# Patient Record
Sex: Male | Born: 1970 | Race: White | Hispanic: No | Marital: Married | Smoking: Former smoker
Health system: Southern US, Community
[De-identification: ages and names within clinical notes are randomized; demographics above are authoritative.]

## PROBLEM LIST (undated history)

## (undated) DIAGNOSIS — I10 Essential (primary) hypertension: Secondary | ICD-10-CM

## (undated) DIAGNOSIS — H9319 Tinnitus, unspecified ear: Secondary | ICD-10-CM

## (undated) DIAGNOSIS — B029 Zoster without complications: Secondary | ICD-10-CM

## (undated) DIAGNOSIS — R42 Dizziness and giddiness: Secondary | ICD-10-CM

## (undated) HISTORY — DX: Dizziness and giddiness: R42

## (undated) HISTORY — DX: Essential (primary) hypertension: I10

## (undated) HISTORY — DX: Tinnitus, unspecified ear: H93.19

## (undated) HISTORY — PX: APPENDECTOMY: SHX54

## (undated) HISTORY — DX: Zoster without complications: B02.9

---

## 2008-06-13 ENCOUNTER — Ambulatory Visit: Payer: Self-pay | Admitting: Occupational Medicine

## 2008-12-02 ENCOUNTER — Ambulatory Visit: Payer: Self-pay | Admitting: Family Medicine

## 2008-12-02 DIAGNOSIS — B029 Zoster without complications: Secondary | ICD-10-CM

## 2008-12-02 HISTORY — DX: Zoster without complications: B02.9

## 2008-12-10 ENCOUNTER — Ambulatory Visit: Payer: Self-pay | Admitting: Occupational Medicine

## 2009-08-07 ENCOUNTER — Ambulatory Visit: Payer: Self-pay | Admitting: Occupational Medicine

## 2009-11-15 ENCOUNTER — Ambulatory Visit: Payer: Self-pay | Admitting: Family Medicine

## 2009-11-15 DIAGNOSIS — R42 Dizziness and giddiness: Secondary | ICD-10-CM

## 2009-11-15 HISTORY — DX: Dizziness and giddiness: R42

## 2009-12-22 ENCOUNTER — Ambulatory Visit: Payer: Self-pay | Admitting: Family Medicine

## 2009-12-22 DIAGNOSIS — J029 Acute pharyngitis, unspecified: Secondary | ICD-10-CM | POA: Insufficient documentation

## 2009-12-23 ENCOUNTER — Encounter: Payer: Self-pay | Admitting: Family Medicine

## 2010-04-02 ENCOUNTER — Ambulatory Visit: Payer: Self-pay | Admitting: Emergency Medicine

## 2010-04-02 DIAGNOSIS — H9319 Tinnitus, unspecified ear: Secondary | ICD-10-CM | POA: Insufficient documentation

## 2010-04-02 HISTORY — DX: Tinnitus, unspecified ear: H93.19

## 2010-09-18 NOTE — Assessment & Plan Note (Signed)
Summary: Sorethroat x 7 dys rm 3   Vital Signs:  Patient Profile:   40 Years Old Male CC:      Cold & URI symptoms Height:     68 inches Weight:      184 pounds O2 Sat:      100 % O2 treatment:    Room Air Temp:     98.0 degrees F oral Pulse rate:   81 / minute Pulse rhythm:   regular Resp:     16 per minute BP sitting:   130 / 91  (right arm) Cuff size:   regular  Vitals Entered By: Areta Haber CMA (Dec 22, 2009 6:55 PM)                  Current Allergies: No known allergies History of Present Illness Chief Complaint: Cold & URI symptoms History of Present Illness: Subjective: Patient complains of persistent sore throat for one week. 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  Eyes       Denies change in vision, eye pain, eye discharge, glasses, contact lenses, and eye surgery. Ear/Nose/Throat/Mouth       Complains of sore throat.      Denies hearing loss/aids, change in hearing, ear pain, ear discharge, dizziness, frequent runny nose, frequent nose bleeds, sinus problems, hoarseness, and tooth pain or bleeding.      Comments: x 7 dys  Respiratory       Denies dry cough, productive cough, wheezing, shortness of breath, asthma, bronchitis, and  emphysema/COPD.  Cardiovascular       Denies murmurs, chest pain, and tires easily with exhertion.    Gastrointestinal       Denies stomach pain, nausea/vomiting, diarrhea, constipation, blood in bowel movements, and indigestion. Genitourniary       Denies painful urination, kidney stones, and loss of urinary control. Neurological       Denies paralysis, seizures, and fainting/blackouts. Musculoskeletal       Denies muscle pain, joint pain, joint stiffness, decreased range of motion, redness, swelling, muscle weakness, and gout.  Skin       Denies bruising, unusual mles/lumps or sores, and hair/skin or nail changes.  Psych       Denies mood changes, temper/anger issues, anxiety/stress, speech problems, depression, and sleep problems. Other Comments: Pt has not seen PCP for this.   Past History:  Past Medical History: Last updated: 12/10/2008 shingles  Past Surgical History: Last updated: 12/02/2008 Denies surgical history  Family History: Last updated: 11/15/2009 Mother living age 6 father- borderline DM, HTN sisters living 45 and 83 healthy  Social History: Last updated: 11/15/2009 Former Smoker-5 year Alcohol use-no Drug use-no Occupation: welder  Risk Factors: Smoking Status: quit (12/02/2008)   Objective:  No acute distress distress Eyes:  Pupils are equal,  round, and reactive to light and accomdation.  Extraocular movement is intact.  Conjunctivae are not inflamed.  Ears:  Canals normal.  Tympanic membranes normal.   Nose:  Normal septum.  Normal turbinates, mildly congested.   No sinus tenderness present.  Pharynx:  Mildly erythematous Neck:  Supple.  Slightly tender shotty anterior/posterior nodes are palpated bilaterally.  Rapid strep test negative  Assessment New Problems: ACUTE PHARYNGITIS (ICD-462)  ? STREP  Plan New Orders: Rapid Strep [40981] T-Culture, Throat [[id number]] Est. Patient Level III [21308] Planning Comments:   Begin Pen  VK.  Throat culture pending.  Ibuprofen for pain. Follow-up with PCP if not improving.   The patient and/or caregiver has been counseled thoroughly with regard to medications prescribed including dosage, schedule, interactions, rationale for use, and possible side effects and they verbalize understanding.  Diagnoses and expected course of recovery discussed and will return if not improved as expected or if the condition worsens. Patient and/or caregiver verbalized understanding.   Appended Document: Sorethroat x 7 dys rm 3 Rapid Strep: Negative

## 2010-09-18 NOTE — Letter (Signed)
Summary: Handout Printed  Printed Handout:  - Rheumatic Fever 

## 2010-09-18 NOTE — Assessment & Plan Note (Signed)
Summary: RIGHT EAR PAIN/TJ   Vital Signs:  Patient Profile:   40 Years Old Male CC:      Right ear pain x 1 week Height:     68 inches Weight:      185 pounds O2 Sat:      96 % O2 treatment:    Room Air Temp:     99.0 degrees F oral Pulse rate:   66 / minute Pulse rhythm:   regular Resp:     14 per minute BP sitting:   127 / 86  (left arm) Cuff size:   regular  Vitals Entered By: Emilio Math (April 02, 2010 12:31 PM)                  Current Allergies (reviewed today): No known allergies History of Present Illness Chief Complaint: Right ear pain x 1 week History of Present Illness: R ear discomfort for 1 week.  Described as ear ringing, mild hearing loss.  No dizziness or vertigo.  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  He thinks it's about 100-110 dB occasionally. No F/C/N/V.  No URI symptoms.  The symptoms have been improving over the last few days but the ringing is still present.  No new meds. No travel. No reccent swimming.  REVIEW OF SYSTEMS Constitutional Symptoms      Denies fever, chills, night sweats, weight loss, weight gain, and fatigue.  Eyes       Denies change in vision, eye pain, eye discharge, glasses, contact lenses, and eye surgery. Ear/Nose/Throat/Mouth       Complains of ear pain.      Denies hearing loss/aids, change in hearing, ear discharge, dizziness, frequent runny nose, frequent nose bleeds, sinus problems, sore throat, hoarseness, and tooth pain or bleeding.  Respiratory       Denies dry cough, productive cough, wheezing, shortness of breath, asthma, bronchitis, and emphysema/COPD.  Cardiovascular       Denies murmurs, chest pain, and tires easily with exhertion.    Gastrointestinal       Denies stomach pain, nausea/vomiting, diarrhea, constipation, blood in bowel movements, and indigestion. Genitourniary       Denies painful urination, kidney stones, and loss of urinary control. Neurological  Denies paralysis, seizures, and fainting/blackouts. Musculoskeletal       Denies muscle pain, joint pain, joint stiffness, decreased range of motion, redness, swelling, muscle weakness, and gout.  Skin       Denies bruising, unusual mles/lumps or sores, and hair/skin or nail changes.  Psych       Denies mood changes, temper/anger issues, anxiety/stress, speech problems, depression, and sleep problems.  Past History:  Past Medical History: Reviewed history from 12/10/2008 and no changes required. shingles  Past Surgical History: Reviewed history from 12/02/2008 and no changes required. Denies surgical history  Family History: Reviewed history from 11/15/2009 and no changes required. Mother living age 21 father- borderline DM, HTN sisters living 12 and 89 healthy  Social History: Reviewed history from 11/15/2009 and no changes required. Former Smoker-5 year Alcohol use-no Drug use-no Occupation: Psychologist, occupational Physical Exam General appearance: well developed, well nourished, no acute distress Head: normocephalic, atraumatic Ears: normal, no lesions or deformities Nasal: mucosa pink, nonedematous, no septal deviation, turbinates normal Oral/Pharynx: tongue normal, posterior pharynx without erythema or exudate Neck: neck supple,  trachea midline, no masses Chest/Lungs: no rales, wheezes, or rhonchi bilateral, breath sounds equal without effort Heart: regular rate and  rhythm, no murmur Neurological: grossly intact and non-focal Skin: no obvious rashes or lesions MSE: oriented to time, place, and person Assessment New Problems: TINNITUS (ICD-388.30)  Labyrnthitis vs Meniere's disease vs Occupational exposure Mild and improving, so hold off 1 week on ENT referral.  No diuretics for Meniere's disease yet, no nausea meds needed or antivirals yet  Patient Education: Patient and/or caregiver instructed in the following: rest.  Plan New Medications/Changes: PREDNISONE (PAK) 10 MG  TABS (PREDNISONE) use as directed  #1 pack x 0, 04/02/2010, Hoyt Koch MD  New Orders: Est. Patient Level II [id number] Planning Comments:   Wear ear protection at work  Follow Up: with ENT if not improving in 1 week or if worsening symptoms  The patient and/or caregiver has been counseled thoroughly with regard to medications prescribed including dosage, schedule, interactions, rationale for use, and possible side effects and they verbalize understanding.  Diagnoses and expected course of recovery discussed and will return if not improved as expected or if the condition worsens. Patient and/or caregiver verbalized understanding.  Prescriptions: PREDNISONE (PAK) 10 MG TABS (PREDNISONE) use as directed  #1 pack x 0   Entered and Authorized by:   Hoyt Koch MD   Signed by:   Hoyt Koch MD on 04/02/2010   Method used:   Print then Give to Patient   RxID:   [id number]   Orders Added: 1)  Est. Patient Level II [95621]  Appended Document: RIGHT EAR PAIN/TJ F/U cll to pt - Pt states getting better and has New Patient Packet forms completed to turn into [facility] on [facility] 04/09/10.

## 2010-09-18 NOTE — Assessment & Plan Note (Signed)
Summary: Rx Written  Prescriptions: PENICILLIN V POTASSIUM 500 MG TABS (PENICILLIN V POTASSIUM) 1 by mouth two times a day for 10 days  #20 x 0   Entered and Authorized by:   Donna Christen MD   Signed by:   Donna Christen MD on 12/22/2009   Method used:   Print then Give to Patient   RxID:   [id number]

## 2010-09-18 NOTE — Assessment & Plan Note (Signed)
Summary: DIZZY,NAUSEA/WB   Vital Signs:  Patient Profile:   40 Years Old Male CC:      dizziness, nausea X 1 day (saturday), returend symptoms again today Height:     68 inches Weight:      182 pounds O2 Sat:      98 % O2 treatment:    Room Air Temp:     97.8 degrees F oral Pulse rate:   82 / minute Resp:     14 per minute BP sitting:   137 / 92  (right arm) Cuff size:   regular  Pt. in pain?   no  Vitals Entered By: Lajean Saver RN (November 15, 2009 2:13 PM)                   Prior Medication List:  TYLENOL 325 MG TABS (ACETAMINOPHEN) 2 every 4 hrs   Updated Prior Medication List: No Medications Current Allergies: No known allergies History of Present Illness Chief Complaint: dizziness, nausea X 1 day (saturday), returend symptoms again today History of Present Illness: Dizzyness and lightheadness end of last week. This cleared up fo a while and then it hit this AM. since then he has had nausea and light headness..   Patient  had a head injury about 1-2 years and had a CT scan done then.   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  Eyes       Denies change in vision, eye pain, eye discharge, glasses, contact lenses, and eye surgery. Ear/Nose/Throat/Mouth       Denies hearing loss/aids, change in hearing, ear pain, ear discharge, dizziness, frequent runny nose, frequent nose bleeds, sinus problems, sore throat, hoarseness, and tooth pain or bleeding.    Respiratory       Denies dry cough, productive cough, wheezing, shortness of breath, asthma, bronchitis, and emphysema/COPD.  Cardiovascular       Denies murmurs, chest pain, and tires easily with exhertion.    Gastrointestinal       Complains of nausea/vomiting.      Denies stomach pain, diarrhea, constipation, blood in bowel movements, and indigestion. Genitourniary       Denies painful urination, kidney stones, and loss of urinary control. Neurological       Complains of weakness.      Denies headaches, loss of or changes in sensation, numbness, tngling, tremors, paralysis, seizures, and fainting/blackouts. Musculoskeletal       Denies muscle pain, joint pain, joint stiffness, decreased range of motion, redness, swelling, muscle weakness, and gout.  Skin       Denies bruising, unusual mles/lumps or sores, and hair/skin or nail changes.  Psych       Denies mood changes, temper/anger issues, anxiety/stress, speech problems, depression, and sleep problems. Other Comments: Dizziness   Past History:  Family History: Last updated: 11/15/2009 Mother living age 57 father- borderline DM, HTN sisters living 28 and 68 healthy  Social History: Last updated: 11/15/2009 Former Smoker-5 year Alcohol use-no Drug use-no Occupation: Psychologist, occupational  Risk Factors: Smoking Status: quit (12/02/2008)  Past Medical History: Reviewed history from 12/10/2008 and no changes required. shingles  Past Surgical History: Reviewed history from 12/02/2008 and no changes required. Denies surgical history  Family History: Reviewed history from 06/13/2008 and no changes required. Mother living age 28 father- borderline DM, HTN sisters living 37 and 53 healthy  Social History: Reviewed history from 12/02/2008 and no changes required. Former Smoker-5 year Alcohol use-no Drug use-no Occupation: Psychologist, occupational Physical Exam General appearance: well developed, well nourished, nmild distress Head:  normocephalic, atraumatic Eyes: conjunctivae and lids normal Ears: inflamed right TM Nasal: mucosa pink, nonedematous, no septal deviation, turbinates normal Oral/Pharynx: tongue normal, posterior pharynx without erythema or exudate Neck: neck supple,  trachea midline, no masses Neurological: grossly intact and non-focal Skin: no obvious rashes or lesions MSE: oriented to time, place, and person Unable to reproduce the symptoms w/movement of his head. Assessment New Problems: OTITIS MEDIA, PURULENT, ACUTE (ICD-382.00) INTERMITTENT VERTIGO (ICD-780.4)  vertigo  inner ear infection  Patient Education: Patient and/or caregiver instructed in the following: rest fluids and Tylenol.  Plan New Medications/Changes: MECLIZINE HCL 25 MG TABS (MECLIZINE HCL) 1 by mouth two times a day as needed vertigo  #20 x 0, 11/15/2009, Hassan Rowan MD AUGMENTIN [phone] MG TABS (AMOXICILLIN-POT CLAVULANATE) 1 by mouth 2 times daily  #20 x 0, 11/15/2009, Hassan Rowan MD PROMETHAZINE HCL 25 MG  TABS (PROMETHAZINE HCL) sig 1 by mouth q6-8hrs as needed for nausea may cause sedation  #12 x 0, 11/15/2009, Hassan Rowan MD  New Orders: Est. Patient Level III [phone] Planning Comments:   as below  Follow Up: Follow up on an as needed basis Follow Up: 5-10 days  The patient and/or caregiver has been counseled thoroughly with regard to medications prescribed including dosage, schedule, interactions, rationale for use, and possible side effects and they verbalize understanding.  Diagnoses and expected course of recovery discussed and will return if not improved as expected or if the condition worsens. Patient and/or caregiver verbalized understanding.  Prescriptions: MECLIZINE HCL 25 MG TABS (MECLIZINE HCL) 1 by mouth two times a day as needed vertigo  #20 x 0   Entered and Authorized by:   Hassan Rowan MD   Signed by:   Hassan Rowan MD on 11/15/2009   Method used:   Printed then faxed to ...       [facility] [address] [phone]* (retail)       [address] [place], [place]  [address]       Ph: [phone] or [phone]       Fax: [phone]   RxID:   [id number] AUGMENTIN 875-125 MG TABS (AMOXICILLIN-POT CLAVULANATE) 1 by mouth 2 times daily  #20 x 0   Entered and Authorized by:   Hassan Rowan MD   Signed by:   Hassan Rowan MD on 11/15/2009   Method used:   Printed then faxed to ...       [facility]  [address] [phone]* (retail)       [address] [place], [place]  [address]       Ph: [phone] or [phone]       Fax: [phone]   RxID:   [id number] PROMETHAZINE HCL 25 MG  TABS (PROMETHAZINE HCL) sig 1 by mouth q6-8hrs as needed for nausea may cause sedation  #12 x 0   Entered and Authorized by:   Hassan Rowan  MD   Signed by:   Hassan Rowan MD on 11/15/2009   Method used:   Printed then faxed to ...       [facility]  [address] [phone]* (retail)       [address] [place], [place]  [address]       Ph: [phone] or [phone]       Fax: [phone]   RxID:   [id number]   Patient Instructions: 1)  Please schedule a follow-up appointment as needed. 2)  Please schedule an appointment with your primary doctor in :in 7-14 days you can establish w/PCP upstairs 3)  Take your antibiotic as prescribed until ALL of it is gone, but stop if you develop a rash or swelling and contact our office as soon as possible. 4)  If dizzyness persist you may need a repeat CTscan

## 2012-09-14 ENCOUNTER — Emergency Department (INDEPENDENT_AMBULATORY_CARE_PROVIDER_SITE_OTHER)
Admission: EM | Admit: 2012-09-14 | Discharge: 2012-09-14 | Disposition: A | Discharge status: Home / Self Care | Payer: BC Managed Care – PPO | Source: Home / Self Care | Attending: Family Medicine | Admitting: Family Medicine

## 2012-09-14 ENCOUNTER — Ambulatory Visit (HOSPITAL_BASED_OUTPATIENT_CLINIC_OR_DEPARTMENT_OTHER)
Admit: 2012-09-14 | Discharge: 2012-09-14 | Disposition: A | Discharge status: Home / Self Care | Payer: BC Managed Care – PPO | Attending: Family Medicine | Admitting: Family Medicine

## 2012-09-14 ENCOUNTER — Telehealth: Payer: Self-pay | Admitting: *Deleted

## 2012-09-14 ENCOUNTER — Encounter (HOSPITAL_BASED_OUTPATIENT_CLINIC_OR_DEPARTMENT_OTHER): Payer: Self-pay

## 2012-09-14 ENCOUNTER — Encounter: Payer: Self-pay | Admitting: Emergency Medicine

## 2012-09-14 DIAGNOSIS — E875 Hyperkalemia: Secondary | ICD-10-CM | POA: Insufficient documentation

## 2012-09-14 DIAGNOSIS — D72829 Elevated white blood cell count, unspecified: Secondary | ICD-10-CM | POA: Insufficient documentation

## 2012-09-14 DIAGNOSIS — K3580 Unspecified acute appendicitis: Secondary | ICD-10-CM | POA: Insufficient documentation

## 2012-09-14 DIAGNOSIS — R109 Unspecified abdominal pain: Secondary | ICD-10-CM

## 2012-09-14 LAB — POCT URINALYSIS DIPSTICK
Spec Grav, UA: >=1.03 (ref 1.005–1.03)
Urobilinogen, UA: 0.2 (ref 0–1)

## 2012-09-14 LAB — POCT CBC W AUTO DIFF (K'VILLE URGENT CARE)

## 2012-09-14 MED ORDER — IOHEXOL 300 MG/ML  SOLN
100.0000 mL | Freq: Once | INTRAMUSCULAR | Status: AC | PRN
  Administered 2012-09-14: 100 mL via INTRAVENOUS

## 2012-09-14 NOTE — ED Notes (Signed)
Lower abdominal pain 24 hrs, can't bend over, weakness. Denies diarrhea, constipation, gas, n&v, dysuria, polyuria, no new sex partners.

## 2012-09-14 NOTE — ED Provider Notes (Addendum)
History     CSN: [id number]  Arrival date & time 09/14/12  1120   None     Chief Complaint  Patient presents with  . Abdominal Pain      HPI Comments: Patient complains of onset of a stomach ache 24 hours ago, pointing to his lower abdomen.  The pain does not radiate.  He believes that he may have had a low grade fever last night, this morning felt hot.  He denies nausea/vomiting but has had anorexia.  He had a normal bowel movement today.  No urinary symptoms.  The pain is worse when he bends over.  No urinary symptoms.  Patient is a 42 y.o. male presenting with abdominal pain. The history is provided by the patient.  Abdominal Pain The primary symptoms of the illness include abdominal pain and fatigue. The primary symptoms of the illness do not include fever, shortness of breath, nausea, vomiting, diarrhea, hematemesis, hematochezia or dysuria. The current episode started yesterday. The onset of the illness was sudden.  The patient has not had a change in bowel habit. Additional symptoms associated with the illness include chills and anorexia. Symptoms associated with the illness do not include diaphoresis, heartburn, constipation, urgency, hematuria, frequency or back pain.    History reviewed. No pertinent past medical history.  History reviewed. No pertinent past surgical history.  Family History  Problem Relation Age of Onset  . Diabetes Father     History  Substance Use Topics  . Smoking status: Former Smoker    Quit date: 09/14/1997  . Smokeless tobacco: Not on file  . Alcohol Use: No      Review of Systems  Constitutional: Positive for chills and fatigue. Negative for fever and diaphoresis.  Respiratory: Negative for shortness of breath.   Gastrointestinal: Positive for abdominal pain and anorexia. Negative for heartburn, nausea, vomiting, diarrhea, constipation, hematochezia and hematemesis.  Genitourinary: Negative for dysuria, urgency, frequency and hematuria.   Musculoskeletal: Negative for back pain.  All other systems reviewed and are negative.    Allergies  Review of patient's allergies indicates no known allergies.  Home Medications  No current outpatient prescriptions on file.  BP 127/84  Pulse 82  Temp 99 F (37.2 C) (Oral)  Resp 14  Ht 5\' 7"  (1.702 m)  Wt 180 lb (81.647 kg)  BMI 28.19 kg/m2  SpO2 97%  Physical Exam Nursing notes and Vital Signs reviewed. Appearance:  Patient appears healthy, stated age, and in no acute distress Eyes:  Pupils are equal, round, and reactive to light and accomodation.  Extraocular movement is intact.  Conjunctivae are not inflamed  Ears:  Canals normal.  Tympanic membranes normal.  Nose:  Mildly congested turbinates.  No sinus tenderness.   Pharynx:  Normal; moist mucous membranes  Neck:  Supple.  No adenopathy Lungs:  Clear to auscultation.  Breath sounds are equal.  Heart:  Regular rate and rhythm without murmurs, rubs, or gallops.  Abdomen:  Tenderness bilateral hypogastric area without masses or hepatosplenomegaly.  There is tenderness over McBurney's point.  No rebound tenderness.  Bowel sounds are quiet.  No CVA or flank tenderness. Negative iliopsoas and obdurator tests Extremities:  No edema.  No calf tenderness Skin:  No rash present.   Genitourinary:  Penis normal without lesions or urethral discharge.  Scrotum is normal.  Testes are descended bilaterally without nodules or tenderness.  No hernias are palpated.  No regional lymphadenopathy palpated  ED Course  Procedures  none   Labs  Reviewed  POCT CBC W AUTO DIFF ([facility])  WBC 17.4; LY 18.4; MO 5.4; GR 76.2; Hgb 16.8; Platelets 248   POCT URINALYSIS DIPSTICK BIL small; KET 15mg /dL; SG >= 1.610; BLO trace intact; PRO 30mg /dL   Ct Abdomen Pelvis W Contrast  09/14/2012  *RADIOLOGY REPORT*  Clinical Data: Bilateral lower quadrant pain.  Nausea. Leukocytosis.  CT ABDOMEN AND PELVIS WITH CONTRAST  Technique:  Multidetector  CT imaging of the abdomen and pelvis was performed following the standard protocol during bolus administration of intravenous contrast.  Contrast: OMNIPAQUE IOHEXOL 300 MG/ML  SOLN  Comparison: None.  Findings: Enlarged appendix is seen with mild periappendiceal inflammatory changes, consistent with acute appendicitis.  No evidence of abscess or free fluid.  No evidence of bowel obstruction.  The liver, gallbladder, pancreas, spleen, adrenal glands, and kidneys are normal in appearance.  No evidence of hydronephrosis. No soft tissue masses or lymphadenopathy identified.  IMPRESSION: Positive for acute appendicitis.  No evidence of abscess, bowel obstruction, or other complication.   Original Report Authenticated By: Myles Rosenthal, M.D.      1. Abdominal pain   2. Acute appendicitis       MDM  Discussed with surgeon Dr. Vale Haven at [facility] who will direct admit patient this evening.        Lattie Haw, MD 09/14/12 1729  Lattie Haw, MD 09/14/12 [phone]

## 2015-07-29 ENCOUNTER — Encounter: Payer: Self-pay | Admitting: Emergency Medicine

## 2015-07-29 ENCOUNTER — Emergency Department (INDEPENDENT_AMBULATORY_CARE_PROVIDER_SITE_OTHER)
Admission: EM | Admit: 2015-07-29 | Discharge: 2015-07-29 | Disposition: A | Discharge status: Home / Self Care | Payer: BLUE CROSS/BLUE SHIELD | Source: Home / Self Care | Attending: Family Medicine | Admitting: Family Medicine

## 2015-07-29 DIAGNOSIS — J069 Acute upper respiratory infection, unspecified: Secondary | ICD-10-CM

## 2015-07-29 DIAGNOSIS — B9789 Other viral agents as the cause of diseases classified elsewhere: Principal | ICD-10-CM

## 2015-07-29 MED ORDER — PREDNISONE 20 MG PO TABS: 20.0000 mg | ORAL_TABLET | Freq: Two times a day (BID) | ORAL | Status: DC

## 2015-07-29 MED ORDER — AZITHROMYCIN 250 MG PO TABS: ORAL_TABLET | ORAL | Status: DC

## 2015-07-29 MED ORDER — BENZONATATE 200 MG PO CAPS: 200.0000 mg | ORAL_CAPSULE | Freq: Every day | ORAL | Status: DC

## 2015-07-29 NOTE — ED Provider Notes (Signed)
CSN: [id number]     Arrival date & time 07/29/15  4098 History   First MD Initiated Contact with Patient 07/29/15 1028     Chief Complaint  Patient presents with  . URI      HPI Comments: Patient complains of four day history of typical cold-like symptoms including mild sore throat, sinus congestion, headache, fatigue, and cough.    History reviewed. No pertinent past medical history. History reviewed. No pertinent past surgical history. Family History  Problem Relation Age of Onset  . Diabetes Father    Social History  Substance Use Topics  . Smoking status: Former Smoker    Quit date: 09/14/1997  . Smokeless tobacco: None  . Alcohol Use: No    Review of Systems + sore throat + cough + sneezing No pleuritic pain No wheezing + nasal congestion + post-nasal drainage No sinus pain/pressure No itchy/red eyes No earache No hemoptysis No SOB No fever/chills No nausea No vomiting No abdominal pain No diarrhea No urinary symptoms No skin rash + fatigue No myalgias + headache Used OTC meds without relief  Allergies  Review of patient's allergies indicates no known allergies.  Home Medications   Prior to Admission medications   Medication Sig Start Date End Date Taking? Authorizing Provider  azithromycin (ZITHROMAX Z-PAK) 250 MG tablet Take 2 tabs today; then begin one tab once daily for 4 more days. (Rx void after 08/06/15) 07/29/15   Lattie Haw, MD  benzonatate (TESSALON) 200 MG capsule Take 1 capsule (200 mg total) by mouth at bedtime. Take as needed for cough 07/29/15   Lattie Haw, MD  predniSONE (DELTASONE) 20 MG tablet Take 1 tablet (20 mg total) by mouth 2 (two) times daily. Take with food. 07/29/15   Lattie Haw, MD   Meds Ordered and Administered this Visit  Medications - No data to display  BP 147/88 mmHg  Pulse 87  Temp(Src) 98.4 F (36.9 C) (Oral)  Ht  (1.727 m)  Wt 184 lb 12 oz (83.802 kg)  BMI 28.10 kg/m2  SpO2 97% No data  found.   Physical Exam Nursing notes and Vital Signs reviewed. Appearance:  Patient appears stated age, and in no acute distress Eyes:  Pupils are equal, round, and reactive to light and accomodation.  Extraocular movement is intact.  Conjunctivae are not inflamed  Ears:  Canals normal.  Tympanic membranes normal.  Nose:  Congested turbinates.  No sinus tenderness.  Pharynx:  Normal Neck:  Supple.  Tender enlarged posterior nodes are palpated bilaterally  Lungs:  Clear to auscultation.  Breath sounds are equal.  Moving air well. Heart:  Regular rate and rhythm without murmurs, rubs, or gallops.  Abdomen:  Nontender without masses or hepatosplenomegaly.  Bowel sounds are present.  No CVA or flank tenderness.  Extremities:  No edema.   Skin:  No rash present.   ED Course  Procedures none    MDM   1. Viral URI with cough    There is no evidence of bacterial infection today.  Treat symptomatically for now  Begin prednisone burst.  Prescription written for Benzonatate (Tessalon) to take at bedtime for night-time cough.  Take plain guaifenesin (  extended release tabs such as Mucinex) twice daily, with plenty of water, for cough and congestion.  May add Pseudoephedrine ( , one or two every 4 to 6 hours) for sinus congestion.  Get adequate rest.   May use Afrin nasal spray (or generic oxymetazoline) twice daily for about  5 days and then discontinue.  Also recommend using saline nasal spray several times daily and saline nasal irrigation (AYR is a common brand).  Use Flonase nasal spray each morning after using Afrin nasal spray and saline nasal irrigation. Try warm salt water gargles for sore throat.  Stop all antihistamines for now, and other non-prescription cough/cold preparations. Begin Azithromycin if not improving about one week or if persistent fever develops (Given a prescription to hold, with an expiration date)  Follow-up with family doctor if not improving about10 days.      Lattie HawStephen A Lyndsi Altic, MD 08/04/15 1136

## 2015-07-29 NOTE — Discharge Instructions (Signed)
Take plain guaifenesin (1200mg extended release tabs such as Mucinex) twice daily, with plenty of water, for cough and congestion.  May add Pseudoephedrine (30mg, one or two every 4 to 6 hours) for sinus congestion.  Get adequate rest.   °May use Afrin nasal spray (or generic oxymetazoline) twice daily for about 5 days and then discontinue.  Also recommend using saline nasal spray several times daily and saline nasal irrigation (AYR is a common brand).  Use Flonase nasal spray each morning after using Afrin nasal spray and saline nasal irrigation. °Try warm salt water gargles for sore throat.  °Stop all antihistamines for now, and other non-prescription cough/cold preparations. °Begin Azithromycin if not improving about one week or if persistent fever develops   °Follow-up with family doctor if not improving about10 days.  °

## 2015-07-29 NOTE — ED Notes (Signed)
Pt c/o head cold, congestion, productive cough and runny nose.

## 2016-10-30 ENCOUNTER — Encounter: Payer: Self-pay | Admitting: Emergency Medicine

## 2016-10-30 ENCOUNTER — Emergency Department (INDEPENDENT_AMBULATORY_CARE_PROVIDER_SITE_OTHER): Payer: BLUE CROSS/BLUE SHIELD

## 2016-10-30 ENCOUNTER — Emergency Department (INDEPENDENT_AMBULATORY_CARE_PROVIDER_SITE_OTHER)
Admission: EM | Admit: 2016-10-30 | Discharge: 2016-10-30 | Disposition: A | Discharge status: Home / Self Care | Payer: BLUE CROSS/BLUE SHIELD | Source: Home / Self Care | Attending: Family Medicine | Admitting: Family Medicine

## 2016-10-30 DIAGNOSIS — R51 Headache: Secondary | ICD-10-CM | POA: Diagnosis not present

## 2016-10-30 DIAGNOSIS — M542 Cervicalgia: Secondary | ICD-10-CM | POA: Diagnosis not present

## 2016-10-30 MED ORDER — PREDNISONE 20 MG PO TABS: ORAL_TABLET | ORAL | 0 refills | Status: DC

## 2016-10-30 NOTE — ED Triage Notes (Signed)
Patient has been having headaches for past 3-4 weeks; has only taken occasional ibuprofen.Denies knowing he has seasonal allergies. No OTCs today.

## 2016-10-30 NOTE — Discharge Instructions (Signed)
Apply ice pack for 20 to 30 minutes, 3 to 4 times daily  Continue until pain and swelling decrease.  °
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
## 2017-12-30 ENCOUNTER — Encounter: Payer: Self-pay | Admitting: Physician Assistant

## 2017-12-30 ENCOUNTER — Ambulatory Visit (INDEPENDENT_AMBULATORY_CARE_PROVIDER_SITE_OTHER): Payer: BLUE CROSS/BLUE SHIELD | Admitting: Physician Assistant

## 2017-12-30 VITALS — BP 153/80 | HR 69 | Ht 68.0 in | Wt 188.0 lb

## 2017-12-30 DIAGNOSIS — Z7689 Persons encountering health services in other specified circumstances: Secondary | ICD-10-CM

## 2017-12-30 DIAGNOSIS — Z130 Encounter for screening for diseases of the blood and blood-forming organs and certain disorders involving the immune mechanism: Secondary | ICD-10-CM | POA: Diagnosis not present

## 2017-12-30 DIAGNOSIS — I10 Essential (primary) hypertension: Secondary | ICD-10-CM | POA: Diagnosis not present

## 2017-12-30 DIAGNOSIS — Z1389 Encounter for screening for other disorder: Secondary | ICD-10-CM | POA: Diagnosis not present

## 2017-12-30 DIAGNOSIS — Z6828 Body mass index (BMI) 28.0-28.9, adult: Secondary | ICD-10-CM

## 2017-12-30 DIAGNOSIS — Z13220 Encounter for screening for lipoid disorders: Secondary | ICD-10-CM

## 2017-12-30 DIAGNOSIS — Z131 Encounter for screening for diabetes mellitus: Secondary | ICD-10-CM

## 2017-12-30 NOTE — Patient Instructions (Addendum)
Physical Activity Recommendations for modifying lipids and lowering blood pressure Engage in aerobic physical activity to reduce LDL-cholesterol, non-HDL-cholesterol, and blood pressure  Frequency: 3-4 sessions per week  Intensity: moderate to vigorous  Duration: 40 minutes on average   1. Aerobic exercise  Frequency: 3-5 sessions per week  Intensity: 50-80% capacity  Duration: 20 - 60 minutes  Examples: walking, treadmill, cycling, rowing, stair climbing, and arm/leg ergometry  2. Resistance exercise  Frequency: 2-3 sessions per week  Intensity: 10-15 repetitions/set to moderate fatigue  Duration: 1-3 sets of 8-10 upper and lower body exercises  Examples: calisthenics, elastic bands, cuff/hand weights, dumbbels, free weights, wall pulleys, and weight machines  Heart-Healthy Lifestyle  Eating a diet rich in vegetables, fruits and whole grains: also includes low-fat dairy products, poultry, fish, legumes, and nuts; limit intake of sweets, sugar-sweetened beverages and red meats  Getting regular exercise  Maintaining a healthy weight  Not smoking or getting help quitting  Staying on top of your health; for some people, lifestyle changes alone may not be enough to prevent a heart attack or stroke. In these cases, taking a statin at the right dose will most likely be necessary    DASH Eating Plan DASH stands for "Dietary Approaches to Stop Hypertension." The DASH eating plan is a healthy eating plan that has been shown to reduce high blood pressure (hypertension). It may also reduce your risk for type 2 diabetes, heart disease, and stroke. The DASH eating plan may also help with weight loss. What are tips for following this plan? General guidelines  Avoid eating more than 2,300 mg (milligrams) of salt (sodium) a day. If you have hypertension, you may need to reduce your sodium intake to 1,500 mg a day.  Limit alcohol intake to no more than 1 drink a day for nonpregnant  women and 2 drinks a day for men. One drink equals 12 oz of beer, 5 oz of wine, or 1 oz of hard liquor.  Work with your health care provider to maintain a healthy body weight or to lose weight. Ask what an ideal weight is for you.  Get at least 30 minutes of exercise that causes your heart to beat faster (aerobic exercise) most days of the week. Activities may include walking, swimming, or biking.  Work with your health care provider or diet and nutrition specialist (dietitian) to adjust your eating plan to your individual calorie needs. Reading food labels  Check food labels for the amount of sodium per serving. Choose foods with less than 5 percent of the Daily Value of sodium. Generally, foods with less than 300 mg of sodium per serving fit into this eating plan.  To find whole grains, look for the word "whole" as the first word in the ingredient list. Shopping  Buy products labeled as "low-sodium" or "no salt added."  Buy fresh foods. Avoid canned foods and premade or frozen meals. Cooking  Avoid adding salt when cooking. Use salt-free seasonings or herbs instead of table salt or sea salt. Check with your health care provider or pharmacist before using salt substitutes.  Do not fry foods. Cook foods using healthy methods such as baking, boiling, grilling, and broiling instead.  Cook with heart-healthy oils, such as olive, canola, soybean, or sunflower oil. Meal planning   Eat a balanced diet that includes: ? 5 or more servings of fruits and vegetables each day. At each meal, try to fill half of your plate with fruits and vegetables. ? Up to 6-8  servings of whole grains each day. ? Less than 6 oz of lean meat, poultry, or fish each day. A 3-oz serving of meat is about the same size as a deck of cards. One egg equals 1 oz. ? 2 servings of low-fat dairy each day. ? A serving of nuts, seeds, or beans 5 times each week. ? Heart-healthy fats. Healthy fats called Omega-3 fatty acids  are found in foods such as flaxseeds and coldwater fish, like sardines, salmon, and mackerel.  Limit how much you eat of the following: ? Canned or prepackaged foods. ? Food that is high in trans fat, such as fried foods. ? Food that is high in saturated fat, such as fatty meat. ? Sweets, desserts, sugary drinks, and other foods with added sugar. ? Full-fat dairy products.  Do not salt foods before eating.  Try to eat at least 2 vegetarian meals each week.  Eat more home-cooked food and less restaurant, buffet, and fast food.  When eating at a restaurant, ask that your food be prepared with less salt or no salt, if possible. What foods are recommended? The items listed may not be a complete list. Talk with your dietitian about what dietary choices are best for you. Grains Whole-grain or whole-wheat bread. Whole-grain or whole-wheat pasta. Brown rice. Orpah Cobb. Bulgur. Whole-grain and low-sodium cereals. Pita bread. Low-fat, low-sodium crackers. Whole-wheat flour tortillas. Vegetables Fresh or frozen vegetables (raw, steamed, roasted, or grilled). Low-sodium or reduced-sodium tomato and vegetable juice. Low-sodium or reduced-sodium tomato sauce and tomato paste. Low-sodium or reduced-sodium canned vegetables. Fruits All fresh, dried, or frozen fruit. Canned fruit in natural juice (without added sugar). Meat and other protein foods Skinless chicken or [place]. Ground chicken or [place]. Pork with fat trimmed off. Fish and seafood. Egg whites. Dried beans, peas, or lentils. Unsalted nuts, nut butters, and seeds. Unsalted canned beans. Lean cuts of beef with fat trimmed off. Low-sodium, lean deli meat. Dairy Low-fat (1%) or fat-free (skim) milk. Fat-free, low-fat, or reduced-fat cheeses. Nonfat, low-sodium ricotta or cottage cheese. Low-fat or nonfat yogurt. Low-fat, low-sodium cheese. Fats and oils Soft margarine without trans fats. Vegetable oil. Low-fat, reduced-fat, or light  mayonnaise and salad dressings (reduced-sodium). Canola, safflower, olive, soybean, and sunflower oils. Avocado. Seasoning and other foods Herbs. Spices. Seasoning mixes without salt. Unsalted popcorn and pretzels. Fat-free sweets. What foods are not recommended? The items listed may not be a complete list. Talk with your dietitian about what dietary choices are best for you. Grains Baked goods made with fat, such as croissants, muffins, or some breads. Dry pasta or rice meal packs. Vegetables Creamed or fried vegetables. Vegetables in a cheese sauce. Regular canned vegetables (not low-sodium or reduced-sodium). Regular canned tomato sauce and paste (not low-sodium or reduced-sodium). Regular tomato and vegetable juice (not low-sodium or reduced-sodium). Rosita Fire. Olives. Fruits Canned fruit in a light or heavy syrup. Fried fruit. Fruit in cream or butter sauce. Meat and other protein foods Fatty cuts of meat. Ribs. Fried meat. Tomasa Blase. Sausage. Bologna and other processed lunch meats. Salami. Fatback. Hotdogs. Bratwurst. Salted nuts and seeds. Canned beans with added salt. Canned or smoked fish. Whole eggs or egg yolks. Chicken or [place] with skin. Dairy Whole or 2% milk, cream, and half-and-half. Whole or full-fat cream cheese. Whole-fat or sweetened yogurt. Full-fat cheese. Nondairy creamers. Whipped toppings. Processed cheese and cheese spreads. Fats and oils Butter. Stick margarine. Lard. Shortening. Ghee. Bacon fat. Tropical oils, such as coconut, palm kernel, or palm oil. Seasoning and other  foods Salted popcorn and pretzels. Onion salt, garlic salt, seasoned salt, table salt, and sea salt. Worcestershire sauce. Tartar sauce. Barbecue sauce. Teriyaki sauce. Soy sauce, including reduced-sodium. Steak sauce. Canned and packaged gravies. Fish sauce. Oyster sauce. Cocktail sauce. Horseradish that you find on the shelf. Ketchup. Mustard. Meat flavorings and tenderizers. Bouillon cubes. Hot sauce and  Tabasco sauce. Premade or packaged marinades. Premade or packaged taco seasonings. Relishes. Regular salad dressings. Where to find more information:  National Heart, Lung, and Blood Institute: PopSteam.is  American Heart Association: www.heart.org Summary  The DASH eating plan is a healthy eating plan that has been shown to reduce high blood pressure (hypertension). It may also reduce your risk for type 2 diabetes, heart disease, and stroke.  With the DASH eating plan, you should limit salt (sodium) intake to 2,300 mg a day. If you have hypertension, you may need to reduce your sodium intake to 1,500 mg a day.  When on the DASH eating plan, aim to eat more fresh fruits and vegetables, whole grains, lean proteins, low-fat dairy, and heart-healthy fats.  Work with your health care provider or diet and nutrition specialist (dietitian) to adjust your eating plan to your individual calorie needs. This information is not intended to replace advice given to you by your health care provider. Make sure you discuss any questions you have with your health care provider. Document Released: 07/25/2011 Document Revised: 07/29/2016 Document Reviewed: 07/29/2016 Elsevier Interactive Patient Education  [employer].

## 2017-12-30 NOTE — Progress Notes (Signed)
HPI:                                                                Stephen Nelson is a 47 y.o. male who presents to [facility]: [facility] today to establish care  Current concerns: blood pressure  Has been monitoring his BP's at home. BP range 150's/80's. He reports he has started drinking 1-2 beers nightly to see if this would help lower his blood pressure by relaxing him. He recently began exercising (biking x 45 minutes, 2 d/week) Denies acute vision change, headache, chest pain with exertion, orthopnea, lightheadedness, syncope and edema. He has a family history of hypertension in his father. Risk factors include: male sex, family history   No flowsheet data found.  No flowsheet data found.    Past Medical History:  Diagnosis Date  . 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 INTERMITTENT VERTIGO 11/15/2009   Qualifier: Diagnosis of  By: Thurmond Butts MD, Dennard Nip    . 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 APPENDECTOMY     Social History   Tobacco Use  . Smoking status: Former Smoker    Last attempt to quit: 09/14/1997    Years since quitting: 20.3  . Smokeless tobacco: Never Used  Substance Use Topics  . Alcohol use: No   family history includes Diabetes in his father.    ROS: Review of Systems  Eyes: Positive for blurred vision.     Medications: No current outpatient medications on file.   No current facility-administered medications for this visit.    No Known Allergies     Objective:  BP (!) 153/80   Pulse 69   Ht  (1.727 m)   Wt 188 lb (85.3 kg)   BMI 28.59 kg/m  Gen:  alert, not ill-appearing, no distress, appropriate for age HEENT: head normocephalic without obvious abnormality, conjunctiva and cornea clear, trachea midline Pulm:  Normal work of breathing, normal phonation, clear to auscultation bilaterally, no wheezes, rales or rhonchi CV: Normal rate, regular rhythm, s1 and s2 distinct, no murmurs, clicks or rubs; radial pulses 2+ symmetric, no carotid bruit  Neuro: alert and oriented x 3, no tremor MSK: extremities atraumatic, normal gait and station, no peripheral edema Skin: intact, no rashes on exposed skin, no jaundice, no cyanosis Psych: well-groomed, cooperative, good eye contact, euthymic mood, affect mood-congruent, speech is articulate, and thought processes clear and goal-directed    No results found for this or any previous visit (from the past 72 hour(s)). No results found.    Assessment and Plan: 47 y.o. male with   Encounter to establish care  Hypertension goal BP (blood pressure) < 130/80 - Plan: Comprehensive metabolic panel  Screening for diabetes mellitus - Plan: Hemoglobin A1c  Screening for blood disease - Plan: CBC, Comprehensive metabolic panel  Screening for lipid disorders - Plan: Lipid Panel w/reflex Direct LDL  Screening for blood or protein in urine - Plan: Urinalysis, Routine w reflex microscopic  BMI 28.0-28.9,adult  - Personally reviewed PMH, PSH, PFH, medications,  allergies, HM - Age-appropriate cancer screening: n/a - Influenza n/a - Tdap declined - PHQ2 negative   Hypertension BP Readings from Last 3 Encounters:  12/30/17 (!) 153/80  10/30/16 142/89  07/29/15 147/88  - home and office BP's are out of range. Labs and UA pending - he would like to defer antihypertensive medication. CVD risk factors include: male sex, family history - recommend 3 months of aggressive therapeutic lifestyle changes. If unable to get BP <130/80, at that point recommend medication - counseled that there is no therapuetic benefit of drinking alcohol to reduce BP and he should discontinue this - counseled on weight loss through decreased caloric intake and increased aerobic  exercise    Patient education and anticipatory guidance given Patient agrees with treatment plan Follow-up in 3 months or sooner as needed if symptoms worsen or fail to improve  Levonne Hubert PA-C

## 2017-12-31 LAB — LIPID PANEL W/REFLEX DIRECT LDL
CHOL/HDL RATIO: 6 (calc) — AB (ref <5.0)
CHOLESTEROL: 233 mg/dL — AB (ref <200)
HDL: 39 mg/dL — ABNORMAL LOW (ref >40)
LDL Cholesterol (Calc): 146 mg/dL (calc) — ABNORMAL HIGH
Non-HDL Cholesterol (Calc): 194 mg/dL (calc) — ABNORMAL HIGH (ref <130)
Triglycerides: 312 mg/dL — ABNORMAL HIGH (ref <150)

## 2017-12-31 LAB — URINALYSIS, ROUTINE W REFLEX MICROSCOPIC
Bilirubin Urine: NEGATIVE
GLUCOSE, UA: NEGATIVE
HGB URINE DIPSTICK: NEGATIVE
Ketones, ur: NEGATIVE
LEUKOCYTES UA: NEGATIVE
NITRITE: NEGATIVE
PH: 6.5 (ref 5.0–8.0)
Protein, ur: NEGATIVE
SPECIFIC GRAVITY, URINE: 1.026 (ref 1.001–1.03)

## 2017-12-31 LAB — CBC
HCT: 45.1 % (ref 38.5–50.0)
HEMOGLOBIN: 16.1 g/dL (ref 13.2–17.1)
MCH: 29.6 pg (ref 27.0–33.0)
MCHC: 35.7 g/dL (ref 32.0–36.0)
MCV: 82.9 fL (ref 80.0–100.0)
MPV: 11.2 fL (ref 7.5–12.5)
Platelets: 246 10*3/uL (ref 140–400)
RBC: 5.44 10*6/uL (ref 4.20–5.80)
RDW: 12.9 % (ref 11.0–15.0)
WBC: 8.4 10*3/uL (ref 3.8–10.8)

## 2017-12-31 LAB — COMPREHENSIVE METABOLIC PANEL
AG RATIO: 2 (calc) (ref 1.0–2.5)
ALBUMIN MSPROF: 4.7 g/dL (ref 3.6–5.1)
ALKALINE PHOSPHATASE (APISO): 68 U/L (ref 40–115)
ALT: 32 U/L (ref 9–46)
AST: 21 U/L (ref 10–40)
BILIRUBIN TOTAL: 0.4 mg/dL (ref 0.2–1.2)
BUN: 17 mg/dL (ref 7–25)
CHLORIDE: 103 mmol/L (ref 98–110)
CO2: 31 mmol/L (ref 20–32)
CREATININE: 0.95 mg/dL (ref 0.60–1.35)
Calcium: 9.9 mg/dL (ref 8.6–10.3)
GLOBULIN: 2.4 g/dL (ref 1.9–3.7)
Glucose, Bld: 87 mg/dL (ref 65–99)
POTASSIUM: 4.7 mmol/L (ref 3.5–5.3)
Sodium: 141 mmol/L (ref 135–146)
Total Protein: 7.1 g/dL (ref 6.1–8.1)

## 2017-12-31 LAB — HEMOGLOBIN A1C
Hgb A1c MFr Bld: 5.1 % of total Hgb (ref <5.7)
MEAN PLASMA GLUCOSE: 100 (calc)
eAG (mmol/L): 5.5 (calc)

## 2018-01-02 NOTE — Progress Notes (Signed)
Labs look good overall Cholesterol is borderline high. Recommend Mediterranean diet, increased aerobic exercise and weight loss No evidence of diabetes

## 2018-03-31 ENCOUNTER — Ambulatory Visit (INDEPENDENT_AMBULATORY_CARE_PROVIDER_SITE_OTHER): Payer: BLUE CROSS/BLUE SHIELD | Admitting: Physician Assistant

## 2018-03-31 ENCOUNTER — Encounter: Payer: Self-pay | Admitting: Physician Assistant

## 2018-03-31 VITALS — BP 122/83 | HR 80 | Temp 98.3°F | Wt 184.0 lb

## 2018-03-31 DIAGNOSIS — E782 Mixed hyperlipidemia: Secondary | ICD-10-CM | POA: Diagnosis not present

## 2018-03-31 DIAGNOSIS — I10 Essential (primary) hypertension: Secondary | ICD-10-CM

## 2018-03-31 NOTE — Patient Instructions (Addendum)
For your blood pressure: - Goal <130/80 - monitor and log blood pressures at home - check around the same time each day in a relaxed setting - Limit salt to <2000 mg/day - Follow DASH eating plan - limit alcohol to 2 standard drinks per day for men and 1 per day for women - avoid tobacco products - weight loss: 7% of current body weight - follow-up every 6 months for your blood pressure   Physical Activity Recommendations for modifying lipids and lowering blood pressure Engage in aerobic physical activity to reduce LDL-cholesterol, non-HDL-cholesterol, and blood pressure  Frequency: 3-4 sessions per week  Intensity: moderate to vigorous  Duration: 40 minutes on average  Physical Activity Recommendations for secondary prevention 1. Aerobic exercise  Frequency: 3-5 sessions per week  Intensity: 50-80% capacity  Duration: 20 - 60 minutes  Examples: walking, treadmill, cycling, rowing, stair climbing, and arm/leg ergometry  2. Resistance exercise  Frequency: 2-3 sessions per week  Intensity: 10-15 repetitions/set to moderate fatigue  Duration: 1-3 sets of 8-10 upper and lower body exercises  Examples: calisthenics, elastic bands, cuff/hand weights, dumbbels, free weights, wall pulleys, and weight machines  Heart-Healthy Lifestyle  Eating a diet rich in vegetables, fruits and whole grains: also includes low-fat dairy products, poultry, fish, legumes, and nuts; limit intake of sweets, sugar-sweetened beverages and red meats  Getting regular exercise  Maintaining a healthy weight  Not smoking or getting help quitting  Staying on top of your health; for some people, lifestyle changes alone may not be enough to prevent a heart attack or stroke. In these cases, taking a statin at the right dose will most likely be necessary    Managing Your Hypertension Hypertension is commonly called high blood pressure. This is when the force of your blood pressing against the walls of  your arteries is too strong. Arteries are blood vessels that carry blood from your heart throughout your body. Hypertension forces the heart to work harder to pump blood, and may cause the arteries to become narrow or stiff. Having untreated or uncontrolled hypertension can cause heart attack, stroke, kidney disease, and other problems. What are blood pressure readings? A blood pressure reading consists of a higher number over a lower number. Ideally, your blood pressure should be below 120/80. The first ("top") number is called the systolic pressure. It is a measure of the pressure in your arteries as your heart beats. The second ("bottom") number is called the diastolic pressure. It is a measure of the pressure in your arteries as the heart relaxes. What does my blood pressure reading mean? Blood pressure is classified into four stages. Based on your blood pressure reading, your health care provider may use the following stages to determine what type of treatment you need, if any. Systolic pressure and diastolic pressure are measured in a unit called mm Hg. Normal  Systolic pressure: below 120.  Diastolic pressure: below 80. Elevated  Systolic pressure: 120-129.  Diastolic pressure: below 80. Hypertension stage 1  Systolic pressure: 130-139.  Diastolic pressure: 80-89. Hypertension stage 2  Systolic pressure: 140 or above.  Diastolic pressure: 90 or above. What health risks are associated with hypertension? Managing your hypertension is an important responsibility. Uncontrolled hypertension can lead to:  A heart attack.  A stroke.  A weakened blood vessel (aneurysm).  Heart failure.  Kidney damage.  Eye damage.  Metabolic syndrome.  Memory and concentration problems.  What changes can I make to manage my hypertension? Hypertension can be managed by  making lifestyle changes and possibly by taking medicines. Your health care provider will help you make a plan to bring  your blood pressure within a normal range. Eating and drinking  Eat a diet that is high in fiber and potassium, and low in salt (sodium), added sugar, and fat. An example eating plan is called the DASH (Dietary Approaches to Stop Hypertension) diet. To eat this way: ? Eat plenty of fresh fruits and vegetables. Try to fill half of your plate at each meal with fruits and vegetables. ? Eat whole grains, such as whole wheat pasta, brown rice, or whole grain bread. Fill about one quarter of your plate with whole grains. ? Eat low-fat diary products. ? Avoid fatty cuts of meat, processed or cured meats, and poultry with skin. Fill about one quarter of your plate with lean proteins such as fish, chicken without skin, beans, eggs, and tofu. ? Avoid premade and processed foods. These tend to be higher in sodium, added sugar, and fat.  Reduce your daily sodium intake. Most people with hypertension should eat less than 1,500 mg of sodium a day.  Limit alcohol intake to no more than 1 drink a day for nonpregnant women and 2 drinks a day for men. One drink equals 12 oz of beer, 5 oz of wine, or 1 oz of hard liquor. Lifestyle  Work with your health care provider to maintain a healthy body weight, or to lose weight. Ask what an ideal weight is for you.  Get at least 30 minutes of exercise that causes your heart to beat faster (aerobic exercise) most days of the week. Activities may include walking, swimming, or biking.  Include exercise to strengthen your muscles (resistance exercise), such as weight lifting, as part of your weekly exercise routine. Try to do these types of exercises for 30 minutes at least 3 days a week.  Do not use any products that contain nicotine or tobacco, such as cigarettes and e-cigarettes. If you need help quitting, ask your health care provider.  Control any long-term (chronic) conditions you have, such as high cholesterol or diabetes. Monitoring  Monitor your blood pressure  at home as told by your health care provider. Your personal target blood pressure may vary depending on your medical conditions, your age, and other factors.  Have your blood pressure checked regularly, as often as told by your health care provider. Working with your health care provider  Review all the medicines you take with your health care provider because there may be side effects or interactions.  Talk with your health care provider about your diet, exercise habits, and other lifestyle factors that may be contributing to hypertension.  Visit your health care provider regularly. Your health care provider can help you create and adjust your plan for managing hypertension. Will I need medicine to control my blood pressure? Your health care provider may prescribe medicine if lifestyle changes are not enough to get your blood pressure under control, and if:  Your systolic blood pressure is 130 or higher.  Your diastolic blood pressure is 80 or higher.  Take medicines only as told by your health care provider. Follow the directions carefully. Blood pressure medicines must be taken as prescribed. The medicine does not work as well when you skip doses. Skipping doses also puts you at risk for problems. Contact a health care provider if:  You think you are having a reaction to medicines you have taken.  You have repeated (recurrent) headaches.  You  feel dizzy.  You have swelling in your ankles.  You have trouble with your vision. Get help right away if:  You develop a severe headache or confusion.  You have unusual weakness or numbness, or you feel faint.  You have severe pain in your chest or abdomen.  You vomit repeatedly.  You have trouble breathing. Summary  Hypertension is when the force of blood pumping through your arteries is too strong. If this condition is not controlled, it may put you at risk for serious complications.  Your personal target blood pressure may vary  depending on your medical conditions, your age, and other factors. For most people, a normal blood pressure is less than 120/80.  Hypertension is managed by lifestyle changes, medicines, or both. Lifestyle changes include weight loss, eating a healthy, low-sodium diet, exercising more, and limiting alcohol. This information is not intended to replace advice given to you by your health care provider. Make sure you discuss any questions you have with your health care provider. Document Released: 04/29/2012 Document Revised: 07/03/2016 Document Reviewed: 07/03/2016 Elsevier Interactive Patient Education  [employer].

## 2018-03-31 NOTE — Progress Notes (Signed)
HPI:                                                                Stephen Nelson is a 47 y.o. male who [facility] today for hypertension follow-up  HTN: currently managing with lifestyle measures only, declines medication. Checks BP's at home. BP range 130-150's/90's-101. Exercising regularly, bikeriding 2 days per week for 45 minutes. Has lost approximately 5 pounds. Denies vision change, headache, chest pain with exertion, orthopnea, lightheadedness, syncope and edema. Risk factors include: HLD, male sex   Depression screen [facility] 12/30/2017  Decreased Interest 0  Down, Depressed, Hopeless 0  PHQ - 2 Score 0    No flowsheet data found.    Past Medical History:  Diagnosis Date  . 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 Hypertension   . INTERMITTENT VERTIGO 11/15/2009   Qualifier: Diagnosis of  By: Thurmond ButtsWade MD, Dennard NipEugene    . 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 APPENDECTOMY     Social History   Tobacco Use  . Smoking status: Former Smoker    Packs/day: 0.25    Years: 6.00    Pack years: 1.50    Last attempt to quit: 09/14/1997    Years since quitting: 20.5  . Smokeless tobacco: Never Used  Substance Use Topics  . Alcohol use: Yes    Alcohol/week: 1.0 standard drinks    Types: 1 Standard drinks or equivalent per week   family history includes Diabetes in his paternal aunt and paternal grandfather; Heart disease in his father; Hypertension in his father; Stroke in his father.    ROS: negative except as noted in the HPI  Medications: No current outpatient medications on file.   No current facility-administered medications for this visit.    No Known Allergies     Objective:  BP 122/83   Pulse 80   Temp 98.3 F (36.8 C)  (Oral)   Wt 184 lb (83.5 kg)   BMI 27.98 kg/m  Gen:  alert, not ill-appearing, no distress, appropriate for age HEENT: head normocephalic without obvious abnormality, conjunctiva and cornea clear, trachea midline Pulm: Normal work of breathing, normal phonation, clear to auscultation bilaterally, no wheezes, rales or rhonchi CV: Normal rate, regular rhythm, s1 and s2 distinct, no murmurs, clicks or rubs  Neuro: alert and oriented x 3, no tremor MSK: extremities atraumatic, normal gait and station, no peripheral edema Skin: intact, no rashes on exposed skin, no jaundice, no cyanosis Psych: well-groomed, cooperative, good eye contact, euthymic mood, affect mood-congruent, speech is articulate, and thought processes clear and goal-directed    No results found for this or any previous visit (from the past 72 hour(s)). No results found.    Assessment and Plan: 47 y.o. male with   .Elek was seen today for hypertension.  Diagnoses and all orders for this visit:  Hypertension goal BP (blood pressure) < 130/80  Mixed hyperlipidemia   - declines antihypertensive medication. Risks benefits discussed - counseled on aggressive therapeutic lifestyle changes  Patient education and anticipatory guidance given Patient agrees with treatment plan Follow-up in 6 months or sooner as needed if symptoms worsen or fail to improve  Darlyne Russian PA-C

## 2018-04-13 ENCOUNTER — Ambulatory Visit (INDEPENDENT_AMBULATORY_CARE_PROVIDER_SITE_OTHER): Payer: BLUE CROSS/BLUE SHIELD | Admitting: Physician Assistant

## 2018-04-13 VITALS — BP 129/75 | HR 71 | Temp 97.8°F | Wt 187.0 lb

## 2018-04-13 DIAGNOSIS — I10 Essential (primary) hypertension: Secondary | ICD-10-CM

## 2018-04-13 NOTE — Progress Notes (Signed)
Pt came to office today to have his home blood pressure machine checked. Last BP at OV was 122/83 and today it was 129/75.  Vitals:   04/13/18 1617  BP: 129/75  Pulse: 71  Temp: 97.8 F (36.6 C)   BP Readings from Last 3 Encounters:  04/13/18 129/75  03/31/18 122/83  12/30/17 (!) 153/80    Pt's home BP monitor read 145/97, which patient reports that is a typical home reading. Pt states he does feel that his BP machine is off.   Pt against starting any blood pressure medication. He has increased exercise and is continuing to work on diet.  Pt reports he is going to get a new machine and will test blood pressures at home with that. Pt due to scheduled 3 month f/u for HTN in November.

## 2018-04-14 ENCOUNTER — Encounter: Payer: Self-pay | Admitting: Physician Assistant

## 2018-06-24 ENCOUNTER — Ambulatory Visit (INDEPENDENT_AMBULATORY_CARE_PROVIDER_SITE_OTHER): Payer: BLUE CROSS/BLUE SHIELD | Admitting: Physician Assistant

## 2018-06-24 ENCOUNTER — Encounter: Payer: Self-pay | Admitting: Physician Assistant

## 2018-06-24 ENCOUNTER — Ambulatory Visit (INDEPENDENT_AMBULATORY_CARE_PROVIDER_SITE_OTHER): Payer: BLUE CROSS/BLUE SHIELD

## 2018-06-24 VITALS — BP 130/81 | HR 74 | Wt 190.0 lb

## 2018-06-24 DIAGNOSIS — M20019 Mallet finger of unspecified finger(s): Secondary | ICD-10-CM | POA: Diagnosis not present

## 2018-06-24 DIAGNOSIS — S6991XA Unspecified injury of right wrist, hand and finger(s), initial encounter: Secondary | ICD-10-CM

## 2018-06-24 DIAGNOSIS — X58XXXA Exposure to other specified factors, initial encounter: Secondary | ICD-10-CM | POA: Diagnosis not present

## 2018-06-24 NOTE — Patient Instructions (Signed)
Mallet Finger  Mallet finger is an injury that occurs from a blow to the tip of your straightened finger or thumb. It is also known as baseball finger. The blow to your fingertip causes it to bend farther than normal, which tears the cord that attaches to the tip of your finger (extensor tendon). Your extensor tendon is what straightens the end of your finger. If this tendon is damaged, you will not be able to straighten your fingertip. Sometimes, a piece of bone may be pulled away with the tendon (avulsion injury), or the tendon may tear completely. In some cases, surgery may be required to repair the damage.  What are the causes?  Mallet finger is caused by a hard, direct hit to the tip of your finger or thumb. This injury often happens from getting hit in the finger with a hard ball, such as a baseball.  What increases the risk?  This injury is more likely to happen if you play sports that use a hard ball.  What are the signs or symptoms?  The main symptom of this injury is not being able to straighten the tip of your finger. You can manually straighten your fingertip with your other hand, but the finger cannot straighten on its own. Other symptoms may include:   Pain.   Swelling.   Bruising.   Blood under the fingernail.    How is this diagnosed?  Your health care provider may suspect mallet finger if you are not able to extend your fingertip, especially if you recently injured your hand. Your health care provider will do a physical exam. This may include X-rays to see if a piece of bone has been pulled away or if the finger joint has separated (dislocated).  How is this treated?  Mallet finger may be treated with:   Wearing a splint on your fingertip to keep it straight (extended) while the tendon heals.   Surgery to repair the tendon, in severe cases. This may involve:  ? The use of a pin or screw to keep your finger extended and your tendon attached.  ? Taking a piece of tendon from another part of your  body (graft) to replace a torn tendon.    Follow these instructions at home:   Take medicines only as directed by your health care provider.   Wear the splint as directed by your health care provider. Remove it only as directed by your health care provider.   If you take your splint off to dry it or change it, gently press your finger on a flat surface to keep it straight.   If directed, apply ice to the injured area:  ? Put ice in a plastic bag.  ? Place a towel between your skin and the bag.  ? Leave the ice on for 20 minutes, 2-3 times a day.   Raise the injured area above the level of your heart while you are sitting or lying down.  Contact a health care provider if:   You have pain or swelling that is getting worse.   Your finger feels cold.   You cannot extend your finger after treatment.  Get help right away if:   Even after loosening your splint, your finger is:  ? Very red and swollen.  ? White or blue.  ? Numb or tingling.  This information is not intended to replace advice given to you by your health care provider. Make sure you discuss any questions you have with   your health care provider.  Document Released: 08/02/2000 Document Revised: 01/11/2016 Document Reviewed: 06/08/2014  Elsevier Interactive Patient Education  2018 [employer]
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
## 2018-07-31 ENCOUNTER — Encounter: Payer: Self-pay | Admitting: Family Medicine

## 2018-07-31 ENCOUNTER — Ambulatory Visit (INDEPENDENT_AMBULATORY_CARE_PROVIDER_SITE_OTHER): Payer: BLUE CROSS/BLUE SHIELD | Admitting: Family Medicine

## 2018-07-31 ENCOUNTER — Ambulatory Visit (INDEPENDENT_AMBULATORY_CARE_PROVIDER_SITE_OTHER): Payer: BLUE CROSS/BLUE SHIELD

## 2018-07-31 VITALS — BP 148/71 | HR 70 | Ht 68.0 in | Wt 192.0 lb

## 2018-07-31 DIAGNOSIS — S6991XD Unspecified injury of right wrist, hand and finger(s), subsequent encounter: Secondary | ICD-10-CM

## 2018-07-31 DIAGNOSIS — S6991XA Unspecified injury of right wrist, hand and finger(s), initial encounter: Secondary | ICD-10-CM | POA: Diagnosis not present

## 2018-07-31 DIAGNOSIS — M20019 Mallet finger of unspecified finger(s): Secondary | ICD-10-CM | POA: Diagnosis not present

## 2018-07-31 MED ORDER — DICLOFENAC SODIUM 1 % TD GEL: 2.0000 g | Freq: Four times a day (QID) | TRANSDERMAL | 11 refills | Status: DC

## 2018-07-31 NOTE — Progress Notes (Signed)
Stephen Nelson is a 47 y.o. male who presents to [facility] today for follow up mallet finger.   Stephen Nelson was seen about 6 weeks ago for mallet finger of his right index finger.  He has been treated conservatively with extension splinting since then.  He has been very consistent with this plan and notes that he thinks his finger is getting better.  He notes is not painful but it is still a little bit swollen and red and certainly stiff.  He is not tried to test the motion at all.  He is happy with how things are going.    ROS:  As above  Exam:  BP (!) 148/71   Pulse 70   Ht 5\' 8"  (1.727 m)   Wt 192 lb (87.1 kg)   BMI 29.19 kg/m  General: Well Developed, well nourished, and in no acute distress.  Neuro/Psych: Alert and oriented x3, extra-ocular muscles intact, able to move all 4 extremities, sensation grossly intact. Skin: Warm and dry, no rashes noted.  Respiratory: Not using accessory muscles, speaking in full sentences, trachea midline.  Cardiovascular: Pulses palpable, no extremity edema. Abdomen: Does not appear distended. MSK: Right hand fifth digit DIP some extensor lag present.  Still mildly erythematous and swollen.  Intact extension strength.    Lab and Radiology Results X-ray images right fifth digit personally independently reviewed. Improved appearance of avulsion fracture with reabsorption of fragment present. Await formal radiology review.    Assessment and Plan: 47 y.o. male with mallet finger right hand fifth digit.  Significant healing and improvement.  Okay to start taking time out of the splint with gentle activities.  Reasonable to continue splinting with heavy-duty activities for another 2 weeks or so.  Start range of motion activities and use diclofenac gel for stiffness and swelling.  If not improved in 1 month we will proceed with hand therapy or occupational therapy.    Orders Placed This Encounter  Procedures    . DG Finger Little Right    Order Specific Question:   Reason for Exam (SYMPTOM  OR DIAGNOSIS REQUIRED)    Answer:   deformity    Order Specific Question:   Preferred imaging location?    Answer:   [facility]    Order Specific Question:   Radiology Contrast Protocol - do NOT remove file path    Answer:   [email or web address]   Meds ordered this encounter  Medications  . diclofenac sodium (VOLTAREN) 1 % GEL    Sig: Apply 2 g topically 4 (four) times daily. To affected joint.    Dispense:  100 g    Refill:  11    Historical information moved to improve visibility of documentation.  Past Medical History:  Diagnosis Date  . 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 Hypertension   . INTERMITTENT VERTIGO 11/15/2009   Qualifier: Diagnosis of  By: Thurmond Butts MD, Dennard Nip    . 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 APPENDECTOMY     Social History   Tobacco Use  . Smoking status: Former Smoker    Packs/day: 0.25    Years: 6.00    Pack years: 1.50    Last attempt to quit: 09/14/1997    Years since quitting: 20.8  .  Smokeless tobacco: Never Used  Substance Use Topics  . Alcohol use: Yes    Alcohol/week: 1.0 standard drinks    Types: 1 Standard drinks or equivalent per week   family history includes Diabetes in his paternal aunt and paternal grandfather; Heart disease in his father; Hypertension in his father; Stroke in his father.  Medications: Current Outpatient Medications  Medication Sig Dispense Refill  . diclofenac sodium (VOLTAREN) 1 % GEL Apply 2 g topically 4 (four) times daily. To affected joint. 100 g 11   No current facility-administered medications for this visit.    No Known Allergies    Discussed warning signs or symptoms. Please see  discharge instructions. Patient expresses understanding.

## 2018-07-31 NOTE — Patient Instructions (Addendum)
Thank you for coming in today. Ok to start taking the brace off.  Use the brace with heavy activity.  Use the diclofenac gel for pain and swelling up to 4x daily.   In 1 month if not a lot better let me know. Will consider hand physical therapy.

## 2019-01-01 IMAGING — DX DG FINGER LITTLE 2+V*R*
3 series · 3 of 3 positions shown · non-contrast
Comparison: June 24, 2018

CLINICAL DATA: Follow-up right fifth finger ligament injury.

EXAM:
RIGHT LITTLE FINGER 2+V

[finger ap]
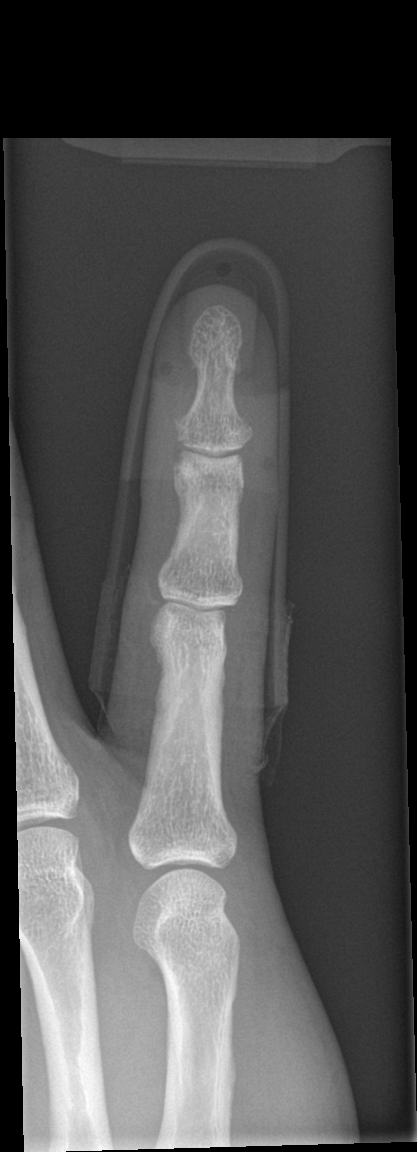

[finger obl]
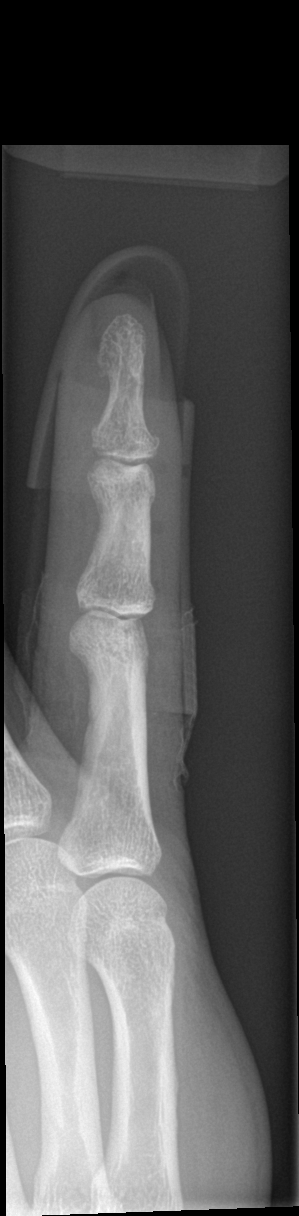

[finger lat]
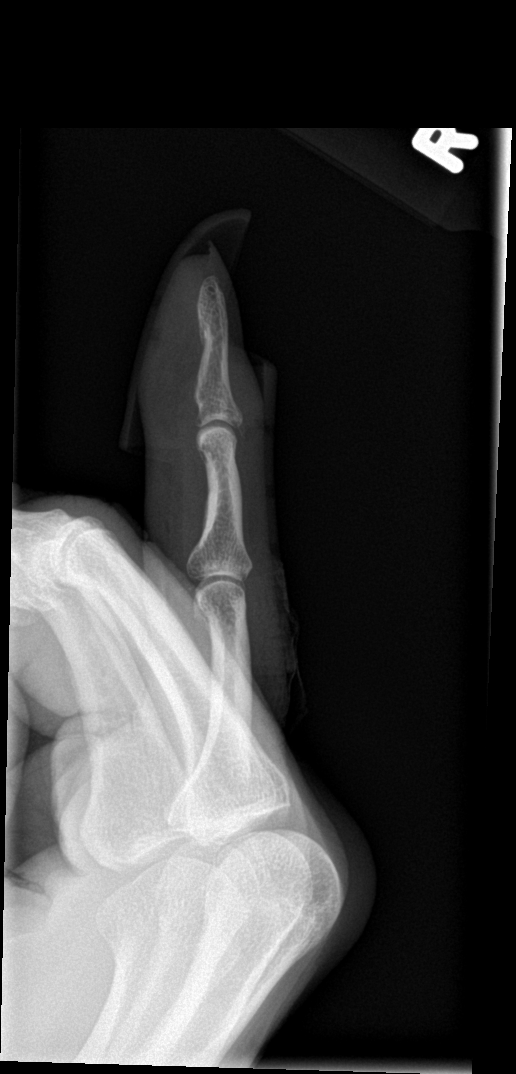

[3 of 3 positions shown; findings below may reference images not displayed]

FINDINGS: The patient's fingers in a brace limiting evaluation. Suspected
dorsal plate avulsion fracture at the DIP seen on the lateral view.
Lucency through the proximal aspect of the distal phalanx and mild
associated irregularity are more prominent in the interval as well.
IMPRESSION: I suspect a fracture through the proximal aspect of the distal fifth
phalanx including a dorsal plate avulsion fracture.

## 2019-04-29 ENCOUNTER — Ambulatory Visit: Payer: BC Managed Care – PPO | Admitting: Physician Assistant

## 2019-05-04 ENCOUNTER — Other Ambulatory Visit: Payer: Self-pay

## 2019-05-04 ENCOUNTER — Encounter: Payer: Self-pay | Admitting: Physician Assistant

## 2019-05-04 ENCOUNTER — Ambulatory Visit (INDEPENDENT_AMBULATORY_CARE_PROVIDER_SITE_OTHER): Payer: BC Managed Care – PPO | Admitting: Physician Assistant

## 2019-05-04 VITALS — BP 136/83 | HR 63 | Temp 98.6°F | Wt 196.0 lb

## 2019-05-04 DIAGNOSIS — I10 Essential (primary) hypertension: Secondary | ICD-10-CM

## 2019-05-04 DIAGNOSIS — E782 Mixed hyperlipidemia: Secondary | ICD-10-CM | POA: Diagnosis not present

## 2019-05-04 NOTE — Patient Instructions (Addendum)
For your blood pressure: - Goal <130/80 (Ideally 120's/70's) - monitor and log blood pressures at home - check around the same time each day in a relaxed setting - Limit salt to <2500 mg/day - Follow DASH (Dietary Approach to Stopping Hypertension) eating plan - Try to get at least 150 minutes of aerobic exercise per week - Aim to go on a brisk walk 30 minutes per day at least 5 days per week. If you're not active, gradually increase how long you walk by 5 minutes each week - limit alcohol: 2 standard drinks per day for men and 1 per day for women - avoid tobacco/nicotine products. Consider smoking cessation if you smoke - weight loss: 7% of current body weight can reduce your blood pressure by 5-10 points - follow-up at least every 6 months for your blood pressure. Follow-up sooner if your BP is not controlled   DASH Eating Plan DASH stands for "Dietary Approaches to Stop Hypertension." The DASH eating plan is a healthy eating plan that has been shown to reduce high blood pressure (hypertension). It may also reduce your risk for type 2 diabetes, heart disease, and stroke. The DASH eating plan may also help with weight loss. What are tips for following this plan?  General guidelines  Avoid eating more than 2,300 mg (milligrams) of salt (sodium) a day. If you have hypertension, you may need to reduce your sodium intake to 1,500 mg a day.  Limit alcohol intake to no more than 1 drink a day for nonpregnant women and 2 drinks a day for men. One drink equals 12 oz of beer, 5 oz of wine, or 1 oz of hard liquor.  Work with your health care provider to maintain a healthy body weight or to lose weight. Ask what an ideal weight is for you.  Get at least 30 minutes of exercise that causes your heart to beat faster (aerobic exercise) most days of the week. Activities may include walking, swimming, or biking.  Work with your health care provider or diet and nutrition specialist (dietitian) to adjust  your eating plan to your individual calorie needs. Reading food labels   Check food labels for the amount of sodium per serving. Choose foods with less than 5 percent of the Daily Value of sodium. Generally, foods with less than 300 mg of sodium per serving fit into this eating plan.  To find whole grains, look for the word "whole" as the first word in the ingredient list. Shopping  Buy products labeled as "low-sodium" or "no salt added."  Buy fresh foods. Avoid canned foods and premade or frozen meals. Cooking  Avoid adding salt when cooking. Use salt-free seasonings or herbs instead of table salt or sea salt. Check with your health care provider or pharmacist before using salt substitutes.  Do not fry foods. Cook foods using healthy methods such as baking, boiling, grilling, and broiling instead.  Cook with heart-healthy oils, such as olive, canola, soybean, or sunflower oil. Meal planning  Eat a balanced diet that includes: ? 5 or more servings of fruits and vegetables each day. At each meal, try to fill half of your plate with fruits and vegetables. ? Up to 6-8 servings of whole grains each day. ? Less than 6 oz of lean meat, poultry, or fish each day. A 3-oz serving of meat is about the same size as a deck of cards. One egg equals 1 oz. ? 2 servings of low-fat dairy each day. ? A serving  of nuts, seeds, or beans 5 times each week. ? Heart-healthy fats. Healthy fats called Omega-3 fatty acids are found in foods such as flaxseeds and coldwater fish, like sardines, salmon, and mackerel.  Limit how much you eat of the following: ? Canned or prepackaged foods. ? Food that is high in trans fat, such as fried foods. ? Food that is high in saturated fat, such as fatty meat. ? Sweets, desserts, sugary drinks, and other foods with added sugar. ? Full-fat dairy products.  Do not salt foods before eating.  Try to eat at least 2 vegetarian meals each week.  Eat more home-cooked food  and less restaurant, buffet, and fast food.  When eating at a restaurant, ask that your food be prepared with less salt or no salt, if possible. What foods are recommended? The items listed may not be a complete list. Talk with your dietitian about what dietary choices are best for you. Grains Whole-grain or whole-wheat bread. Whole-grain or whole-wheat pasta. Brown rice. Oatmeal. Quinoa. Bulgur. Whole-grain and low-sodium cereals. Pita bread. Low-fat, low-sodium crackers. Whole-wheat flour tortillas. Vegetables Fresh or frozen vegetables (raw, steamed, roasted, or grilled). Low-sodium or reduced-sodium tomato and vegetable juice. Low-sodium or reduced-sodium tomato sauce and tomato paste. Low-sodium or reduced-sodium canned vegetables. Fruits All fresh, dried, or frozen fruit. Canned fruit in natural juice (without added sugar). Meat and other protein foods Skinless chicken or turkey. Ground chicken or turkey. Pork with fat trimmed off. Fish and seafood. Egg whites. Dried beans, peas, or lentils. Unsalted nuts, nut butters, and seeds. Unsalted canned beans. Lean cuts of beef with fat trimmed off. Low-sodium, lean deli meat. Dairy Low-fat (1%) or fat-free (skim) milk. Fat-free, low-fat, or reduced-fat cheeses. Nonfat, low-sodium ricotta or cottage cheese. Low-fat or nonfat yogurt. Low-fat, low-sodium cheese. Fats and oils Soft margarine without trans fats. Vegetable oil. Low-fat, reduced-fat, or light mayonnaise and salad dressings (reduced-sodium). Canola, safflower, olive, soybean, and sunflower oils. Avocado. Seasoning and other foods Herbs. Spices. Seasoning mixes without salt. Unsalted popcorn and pretzels. Fat-free sweets. What foods are not recommended? The items listed may not be a complete list. Talk with your dietitian about what dietary choices are best for you. Grains Baked goods made with fat, such as croissants, muffins, or some breads. Dry pasta or rice meal  packs. Vegetables Creamed or fried vegetables. Vegetables in a cheese sauce. Regular canned vegetables (not low-sodium or reduced-sodium). Regular canned tomato sauce and paste (not low-sodium or reduced-sodium). Regular tomato and vegetable juice (not low-sodium or reduced-sodium). Pickles. Olives. Fruits Canned fruit in a light or heavy syrup. Fried fruit. Fruit in cream or butter sauce. Meat and other protein foods Fatty cuts of meat. Ribs. Fried meat. Bacon. Sausage. Bologna and other processed lunch meats. Salami. Fatback. Hotdogs. Bratwurst. Salted nuts and seeds. Canned beans with added salt. Canned or smoked fish. Whole eggs or egg yolks. Chicken or turkey with skin. Dairy Whole or 2% milk, cream, and half-and-half. Whole or full-fat cream cheese. Whole-fat or sweetened yogurt. Full-fat cheese. Nondairy creamers. Whipped toppings. Processed cheese and cheese spreads. Fats and oils Butter. Stick margarine. Lard. Shortening. Ghee. Bacon fat. Tropical oils, such as coconut, palm kernel, or palm oil. Seasoning and other foods Salted popcorn and pretzels. Onion salt, garlic salt, seasoned salt, table salt, and sea salt. Worcestershire sauce. Tartar sauce. Barbecue sauce. Teriyaki sauce. Soy sauce, including reduced-sodium. Steak sauce. Canned and packaged gravies. Fish sauce. Oyster sauce. Cocktail sauce. Horseradish that you find on the shelf. Ketchup. Mustard. Meat   flavorings and tenderizers. Bouillon cubes. Hot sauce and Tabasco sauce. Premade or packaged marinades. Premade or packaged taco seasonings. Relishes. Regular salad dressings. Where to find more information:  National Heart, Lung, and Blood Institute: www.nhlbi.nih.gov  American Heart Association: www.heart.org Summary  The DASH eating plan is a healthy eating plan that has been shown to reduce high blood pressure (hypertension). It may also reduce your risk for type 2 diabetes, heart disease, and stroke.  With the DASH eating  plan, you should limit salt (sodium) intake to 2,300 mg a day. If you have hypertension, you may need to reduce your sodium intake to 1,500 mg a day.  When on the DASH eating plan, aim to eat more fresh fruits and vegetables, whole grains, lean proteins, low-fat dairy, and heart-healthy fats.  Work with your health care provider or diet and nutrition specialist (dietitian) to adjust your eating plan to your individual calorie needs. This information is not intended to replace advice given to you by your health care provider. Make sure you discuss any questions you have with your health care provider. Document Released: 07/25/2011 Document Revised: 07/18/2017 Document Reviewed: 07/29/2016 Elsevier Patient Education  2020 Elsevier Inc.  

## 2019-05-04 NOTE — Progress Notes (Signed)
HPI:                                                                Stephen Nelson is a 48 y.o. male who presents to [facility] today for HTN follow-up  HTN: currently managing with lifestyle measures only, declines medication. Checks BP's at home.  Reports he does not trust his home BP machine. SBP 145-150's/high 80's-90's. He had this machine replace the previous machine which was found to be inaccurate in our office. Exercising regularly, biking 2 days per week approx 20 miles/week. Admits he eats fast food regularly and diet could be better.  Denies vision change, headache, chest pain with exertion, orthopnea, lightheadedness, syncope and edema.  Risk factors include: HLD, male sex  Requesting documentation for his insurance company that he is treating his hypertension.  Depression screen [facility] 2/9 12/30/2017  Decreased Interest 0  Down, Depressed, Hopeless 0  PHQ - 2 Score 0    No flowsheet data found.    Past Medical History:  Diagnosis Date  . HERPES ZOSTER 12/02/2008   Qualifier: Diagnosis of  By: Assunta Found MD, Adline Peals: Diagnosis of  By: Vern Claude    . Hypertension   . INTERMITTENT VERTIGO 11/15/2009   Qualifier: Diagnosis of  By: Alveta Heimlich MD, Cornelia Copa    . 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 APPENDECTOMY     Social History   Tobacco Use  . Smoking status: Former Smoker    Packs/day: 0.25    Years: 6.00    Pack years: 1.50    Quit date: 09/14/1997    Years since quitting: 21.6  . Smokeless tobacco: Never Used  Substance Use Topics  . Alcohol use: Yes    Alcohol/week: 1.0 standard drinks    Types: 1 Standard drinks or equivalent per week   family history includes Diabetes in his paternal aunt and paternal grandfather; Heart disease in his father; Hypertension in his father; Stroke in his father.    ROS:  negative except as noted in the HPI  Medications: No current outpatient medications on file.   No current facility-administered medications for this visit.    No Known Allergies     Objective:  BP 132/88   Pulse 63   Temp 98.6 F (37 C) (Oral)   Wt 196 lb (88.9 kg)   BMI 29.80 kg/m   Vitals:   05/04/19 1632 05/04/19 1642  BP: 132/88 136/83  Pulse: 63   Temp: 98.6 F (37 C)     Wt Readings from Last 3 Encounters:  05/04/19 196 lb (88.9 kg)  07/31/18 192 lb (87.1 kg)  06/24/18 190 lb (86.2 kg)   Temp Readings from Last 3 Encounters:  05/04/19 98.6 F (37 C) (Oral)  04/13/18 97.8 F (36.6 C) (Oral)  03/31/18 98.3 F (36.8 C) (Oral)   BP Readings from Last 3 Encounters:  05/04/19 136/83  07/31/18 (!) 148/71  06/24/18 130/81   Pulse Readings from Last 3 Encounters:  05/04/19 63  07/31/18 70  06/24/18 74    Gen:  alert, not ill-appearing, no distress, appropriate for age 30: head normocephalic  without obvious abnormality, conjunctiva and cornea clear, trachea midline Pulm: Normal work of breathing, normal phonation, clear to auscultation bilaterally, no wheezes, rales or rhonchi CV: Normal rate, regular rhythm, s1 and s2 distinct, no murmurs, clicks or rubs ; no carotid bruit Neuro: alert and oriented x 3, no tremor MSK: extremities atraumatic, normal gait and station, no peripheral edema Skin: intact, no rashes on exposed skin, no jaundice, no cyanosis Psych: well-groomed, cooperative, good eye contact, euthymic mood, affect mood-congruent, speech is articulate, and thought processes clear and goal-directed  Lab Results  Component Value Date   CREATININE 0.95 12/30/2017   BUN 17 12/30/2017   NA 141 12/30/2017   K 4.7 12/30/2017   CL 103 12/30/2017   CO2 31 12/30/2017   Lab Results  Component Value Date   ALT 32 12/30/2017   AST 21 12/30/2017   BILITOT 0.4 12/30/2017   Lab Results  Component Value Date   CHOL 233 (H) 12/30/2017   HDL 39 (L)  12/30/2017   LDLCALC 146 (H) 12/30/2017   TRIG 312 (H) 12/30/2017   CHOLHDL 6.0 (H) 12/30/2017     The 10-year ASCVD risk score Denman George(Goff DC Jr., et al., 2013) is: 5.7%   Values used to calculate the score:     Age: [age] years     Sex: Male     Is Non-Hispanic African American: No     Diabetic: No     Tobacco smoker: No     Systolic Blood Pressure: 136 mmHg     Is BP treated: No     HDL Cholesterol: 39 mg/dL     Total Cholesterol: 233 mg/dL     Assessment and Plan: 48 y.o. male with   .[name] was seen today for hypertension.  Diagnoses and all orders for this visit:  Hypertension goal BP (blood pressure) < 130/80 -     Lipid Panel w/reflex Direct LDL -     COMPLETE METABOLIC PANEL WITH GFR -     CBC  Mixed hyperlipidemia -     Lipid Panel w/reflex Direct LDL -     COMPLETE METABOLIC PANEL WITH GFR -     CBC   10 yr ASCVD risk 5.7% Fasting lipids pending, consider statin therapy if risk remains above 5% or LDL>160 Patient continues to defer antihypertensive medication in favor of lifestyle changes Counseled on DASH diet and to reduce sodium content, which can be hidden in processed/restaurant-prepared food Counseled on therapeutic lifestyle changes including weight loss  Letter provided for insurance company (see letters)    Patient education and anticipatory guidance given Patient agrees with treatment plan Follow-up in 6 months or sooner as needed if symptoms worsen or fail to improve  Levonne Hubertharley E. Lorrie Gargan PA-C

## 2019-05-06 DIAGNOSIS — I10 Essential (primary) hypertension: Secondary | ICD-10-CM | POA: Diagnosis not present

## 2019-05-06 DIAGNOSIS — E782 Mixed hyperlipidemia: Secondary | ICD-10-CM | POA: Diagnosis not present

## 2019-05-07 LAB — COMPLETE METABOLIC PANEL WITH GFR
AG Ratio: 1.9 (calc) (ref 1.0–2.5)
ALT: 34 U/L (ref 9–46)
AST: 25 U/L (ref 10–40)
Albumin: 4.6 g/dL (ref 3.6–5.1)
Alkaline phosphatase (APISO): 74 U/L (ref 36–130)
BUN: 16 mg/dL (ref 7–25)
CO2: 29 mmol/L (ref 20–32)
Calcium: 9.8 mg/dL (ref 8.6–10.3)
Chloride: 104 mmol/L (ref 98–110)
Creat: 1.09 mg/dL (ref 0.60–1.35)
GFR, Est African American: 92 mL/min/{1.73_m2} (ref >=60)
GFR, Est Non African American: 79 mL/min/{1.73_m2} (ref >=60)
Globulin: 2.4 g/dL (calc) (ref 1.9–3.7)
Glucose, Bld: 82 mg/dL (ref 65–99)
Potassium: 4.5 mmol/L (ref 3.5–5.3)
Sodium: 141 mmol/L (ref 135–146)
Total Bilirubin: 0.9 mg/dL (ref 0.2–1.2)
Total Protein: 7 g/dL (ref 6.1–8.1)

## 2019-05-07 LAB — CBC
HCT: 47.3 % (ref 38.5–50.0)
Hemoglobin: 16.2 g/dL (ref 13.2–17.1)
MCH: 29.6 pg (ref 27.0–33.0)
MCHC: 34.2 g/dL (ref 32.0–36.0)
MCV: 86.5 fL (ref 80.0–100.0)
MPV: 10.9 fL (ref 7.5–12.5)
Platelets: 221 10*3/uL (ref 140–400)
RBC: 5.47 10*6/uL (ref 4.20–5.80)
RDW: 12.9 % (ref 11.0–15.0)
WBC: 7 10*3/uL (ref 3.8–10.8)

## 2019-05-07 LAB — LIPID PANEL W/REFLEX DIRECT LDL
Cholesterol: 246 mg/dL — ABNORMAL HIGH (ref <200)
HDL: 46 mg/dL (ref >=40)
LDL Cholesterol (Calc): 178 mg/dL (calc) — ABNORMAL HIGH
Non-HDL Cholesterol (Calc): 200 mg/dL (calc) — ABNORMAL HIGH (ref <130)
Total CHOL/HDL Ratio: 5.3 (calc) — ABNORMAL HIGH (ref <5.0)
Triglycerides: 103 mg/dL (ref <150)

## 2019-05-09 ENCOUNTER — Other Ambulatory Visit: Payer: Self-pay | Admitting: Physician Assistant

## 2019-05-09 DIAGNOSIS — E782 Mixed hyperlipidemia: Secondary | ICD-10-CM

## 2019-05-09 MED ORDER — ATORVASTATIN CALCIUM 20 MG PO TABS: 20.0000 mg | ORAL_TABLET | Freq: Every day | ORAL | 3 refills | Status: DC

## 2019-08-15 DIAGNOSIS — Z20828 Contact with and (suspected) exposure to other viral communicable diseases: Secondary | ICD-10-CM | POA: Diagnosis not present

## 2020-05-03 ENCOUNTER — Other Ambulatory Visit: Payer: Self-pay | Admitting: Physician Assistant

## 2020-05-03 DIAGNOSIS — E782 Mixed hyperlipidemia: Secondary | ICD-10-CM

## 2020-05-16 ENCOUNTER — Encounter: Payer: Self-pay | Admitting: Medical-Surgical

## 2020-05-16 ENCOUNTER — Ambulatory Visit (INDEPENDENT_AMBULATORY_CARE_PROVIDER_SITE_OTHER): Payer: BC Managed Care – PPO | Admitting: Medical-Surgical

## 2020-05-16 VITALS — BP 167/105 | HR 70 | Temp 98.3°F | Wt 193.1 lb

## 2020-05-16 DIAGNOSIS — E782 Mixed hyperlipidemia: Secondary | ICD-10-CM | POA: Diagnosis not present

## 2020-05-16 DIAGNOSIS — Z7689 Persons encountering health services in other specified circumstances: Secondary | ICD-10-CM | POA: Diagnosis not present

## 2020-05-16 DIAGNOSIS — I10 Essential (primary) hypertension: Secondary | ICD-10-CM

## 2020-05-16 DIAGNOSIS — Z1211 Encounter for screening for malignant neoplasm of colon: Secondary | ICD-10-CM | POA: Diagnosis not present

## 2020-05-16 MED ORDER — LOSARTAN POTASSIUM 25 MG PO TABS: 25.0000 mg | ORAL_TABLET | Freq: Every day | ORAL | 0 refills | Status: DC

## 2020-05-16 MED ORDER — ATORVASTATIN CALCIUM 20 MG PO TABS: 20.0000 mg | ORAL_TABLET | Freq: Every day | ORAL | 3 refills | Status: DC

## 2020-05-16 NOTE — Progress Notes (Signed)
Subjective:    CC: establish care  HPI: Pleasant 49 year old male presenting to establish care and follow up on HTN and hyperlipidemia.   HTN- No prescription medications. Lifestyle changes made and he stuck with them. Bikes 7-10 miles per week. Made some diet modifications. Dad has heart disease. Denies chest pain, shortness of breath, palpitations, dizziness, headaches, and lower extremity edema. Has had a recent severe headache that was debilitating but has intermittent mild headaches.   Vision changes- noticed changes about a year ago. Previously perfect vision. Now close up is blurry. Changes were sudden, over a couple weeks. No pain, injury, or known contact with chemicals. 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 Last eye exam 2 years ago. Noted some changes but did not give a prescription.   Hyperlipidemia- taking Atorvastatin 20mg  nightly, tolerating well, no side effects.   I reviewed the past medical history, family history, social history, surgical history, and allergies today and no changes were needed.  Please see the problem list section below in epic for further details.  Past Medical History: Past Medical History:  Diagnosis Date  . 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 Hypertension   . INTERMITTENT VERTIGO 11/15/2009   Qualifier: Diagnosis of  By: 11/17/2009 MD, Thurmond Butts    . 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 APPENDECTOMY     Social History: Social History   Socioeconomic History  . Marital status: Married    Spouse name: Not on file  . Number of children: Not on file  . Years of education: Not on file  . Highest education level: Not on file  Occupational History  . Not on file  Tobacco Use   . Smoking status: Former Smoker    Packs/day: 0.25    Years: 6.00    Pack years: 1.50    Quit date: 09/14/1997    Years since quitting: 22.6  . Smokeless tobacco: Never Used  Vaping Use  . Vaping Use: Never used  Substance and Sexual Activity  . Alcohol use: Yes    Alcohol/week: 12.0 standard drinks    Types: 12 Cans of beer per week  . Drug use: Never  . Sexual activity: Not Currently  Other Topics Concern  . Not on file  Social History Narrative  . Not on file   Social Determinants of Health   Financial Resource Strain:   . Difficulty of Paying Living Expenses: Not on file  Food Insecurity:   . Worried About 09/16/1997 in the Last Year: Not on file  . Ran Out of Food in the Last Year: Not on file  Transportation Needs:   . Lack of Transportation (Medical): Not on file  . Lack of Transportation (Non-Medical): Not on file  Physical Activity:   . Days of Exercise per Week: Not on file  . Minutes of Exercise per Session: Not on file  Stress:   . Feeling of Stress : Not on file  Social Connections:   . Frequency of Communication with Friends and Family: Not on file  . Frequency of Social Gatherings with Friends and Family: Not on file  . Attends Religious Services: Not on file  .  Active Member of Clubs or Organizations: Not on file  . Attends Banker Meetings: Not on file  . Marital Status: Not on file   Family History: Family History  Problem Relation Age of Onset  . Hypertension Father   . Heart disease Father   . Stroke Father   . Diabetes Paternal Aunt   . Diabetes Paternal Grandfather    Allergies: No Known Allergies Medications: See med rec.  Review of Systems: See HPI for pertinent positives and negatives.   Objective:    General: Well Developed, well nourished, and in no acute distress.  Neuro: Alert and oriented x3.  HEENT: Normocephalic, atraumatic.  Skin: Warm and dry. Cardiac: Regular rate and rhythm, no murmurs rubs or  gallops, no lower extremity edema.  Respiratory: Clear to auscultation bilaterally. Not using accessory muscles, speaking in full sentences.   Impression and Recommendations:    1. Encounter to establish care Reviewed available information and discussed health care concerns with the patient.  2. Hypertension goal BP (blood pressure) < 130/80 Discussed starting medications for hypertension.  Patient is agreeable so we are starting him on losartan 25 mg daily.  Advised patient to check his blood pressures at home daily and maintain a log of his readings.  When he returns for his nurse visit in 2 weeks, asked patient to please bring his blood pressure cuff so we can check for accuracy.  Checking CMP, CBC, and lipid panel today. - Lipid panel - COMPLETE METABOLIC PANEL WITH GFR - CBC - losartan (COZAAR) 25 MG tablet; Take 1 tablet (25 mg total) by mouth daily.  Dispense: 30 tablet; Refill: 0  3. Mixed hyperlipidemia Checking lipid panel today.  Continue atorvastatin 20 mg daily.  Refills provided. - Lipid panel - atorvastatin (LIPITOR) 20 MG tablet; Take 1 tablet (20 mg total) by mouth daily.  Dispense: 90 tablet; Refill: 3  4. Colon cancer screening Discussed options for colon cancer screening.  Patient would like to complete Cologuard.  Procedure ordered and paperwork completed by MA. - Cologuard  Return in about 2 weeks (around 05/30/2020) for nurse visit for BP check . ___________________________________________ Thayer Ohm, DNP, APRN, FNP-BC Primary Care and Sports Medicine [facility] [place]

## 2020-05-18 DIAGNOSIS — I10 Essential (primary) hypertension: Secondary | ICD-10-CM | POA: Diagnosis not present

## 2020-05-18 DIAGNOSIS — E782 Mixed hyperlipidemia: Secondary | ICD-10-CM | POA: Diagnosis not present

## 2020-05-19 LAB — LIPID PANEL
Cholesterol: 177 mg/dL (ref <200)
HDL: 41 mg/dL (ref >=40)
LDL Cholesterol (Calc): 111 mg/dL (calc) — ABNORMAL HIGH
Non-HDL Cholesterol (Calc): 136 mg/dL (calc) — ABNORMAL HIGH (ref <130)
Total CHOL/HDL Ratio: 4.3 (calc) (ref <5.0)
Triglycerides: 132 mg/dL (ref <150)

## 2020-05-19 LAB — COMPLETE METABOLIC PANEL WITH GFR
AG Ratio: 2.2 (calc) (ref 1.0–2.5)
ALT: 45 U/L (ref 9–46)
AST: 25 U/L (ref 10–35)
Albumin: 4.6 g/dL (ref 3.6–5.1)
Alkaline phosphatase (APISO): 84 U/L (ref 35–144)
BUN: 16 mg/dL (ref 7–25)
CO2: 30 mmol/L (ref 20–32)
Calcium: 9.7 mg/dL (ref 8.6–10.3)
Chloride: 102 mmol/L (ref 98–110)
Creat: 1.18 mg/dL (ref 0.70–1.33)
GFR, Est African American: 83 mL/min/{1.73_m2} (ref >=60)
GFR, Est Non African American: 72 mL/min/{1.73_m2} (ref >=60)
Globulin: 2.1 g/dL (calc) (ref 1.9–3.7)
Glucose, Bld: 91 mg/dL (ref 65–99)
Potassium: 4.2 mmol/L (ref 3.5–5.3)
Sodium: 140 mmol/L (ref 135–146)
Total Bilirubin: 0.9 mg/dL (ref 0.2–1.2)
Total Protein: 6.7 g/dL (ref 6.1–8.1)

## 2020-05-19 LAB — CBC
HCT: 47.6 % (ref 38.5–50.0)
Hemoglobin: 16.3 g/dL (ref 13.2–17.1)
MCH: 29.5 pg (ref 27.0–33.0)
MCHC: 34.2 g/dL (ref 32.0–36.0)
MCV: 86.2 fL (ref 80.0–100.0)
MPV: 11.1 fL (ref 7.5–12.5)
Platelets: 228 10*3/uL (ref 140–400)
RBC: 5.52 10*6/uL (ref 4.20–5.80)
RDW: 12.2 % (ref 11.0–15.0)
WBC: 8.4 10*3/uL (ref 3.8–10.8)

## 2020-05-22 DIAGNOSIS — Z1211 Encounter for screening for malignant neoplasm of colon: Secondary | ICD-10-CM | POA: Diagnosis not present

## 2020-05-22 DIAGNOSIS — Z1212 Encounter for screening for malignant neoplasm of rectum: Secondary | ICD-10-CM | POA: Diagnosis not present

## 2020-05-22 LAB — COLOGUARD: Cologuard: NEGATIVE

## 2020-05-25 ENCOUNTER — Telehealth: Payer: Self-pay

## 2020-05-25 NOTE — Telephone Encounter (Signed)
Pt called and LVM stating that his insurance company is wanting proof of his OV that he had on 05/16/2020 as well as proof that he had labs done. I called and LVM advising that he could log onto MyChart and print off all the requested information from here or if he wants he can come by and sign a release form and we would be able to print off the records from there. Instructed him to call back if he has any further questions or concerns.

## 2020-05-26 LAB — EXTERNAL GENERIC LAB PROCEDURE: COLOGUARD: NEGATIVE

## 2020-05-30 ENCOUNTER — Ambulatory Visit (INDEPENDENT_AMBULATORY_CARE_PROVIDER_SITE_OTHER): Payer: BC Managed Care – PPO | Admitting: Medical-Surgical

## 2020-05-30 ENCOUNTER — Other Ambulatory Visit: Payer: Self-pay

## 2020-05-30 VITALS — BP 133/74 | HR 85 | Wt 194.0 lb

## 2020-05-30 DIAGNOSIS — I10 Essential (primary) hypertension: Secondary | ICD-10-CM

## 2020-05-30 NOTE — Progress Notes (Signed)
   Subjective:    Patient ID: Stephen Nelson, male    DOB: [birth year], 49 y.o.   MRN: [id number]  HPI Patient here for a 2 week blood pressure check.  He reports only taking 1/2 tablet of the losartan 25mg  prescribed.     Review of Systems     Objective:   Physical Exam        Assessment & Plan:  Patient brought his BP cuff from home.  His machine gave the following readings: 145/93 and 141/86.  Patient feels the 1/2 tab is working to get his BP down to an acceptable level.  He will research other BP cuffs to better monitor at home.

## 2020-06-07 ENCOUNTER — Other Ambulatory Visit: Payer: Self-pay | Admitting: Medical-Surgical

## 2020-06-07 DIAGNOSIS — I10 Essential (primary) hypertension: Secondary | ICD-10-CM

## 2020-07-24 ENCOUNTER — Other Ambulatory Visit: Payer: Self-pay | Admitting: Medical-Surgical

## 2020-07-24 DIAGNOSIS — I10 Essential (primary) hypertension: Secondary | ICD-10-CM

## 2020-07-27 ENCOUNTER — Ambulatory Visit (INDEPENDENT_AMBULATORY_CARE_PROVIDER_SITE_OTHER): Payer: BC Managed Care – PPO | Admitting: Medical-Surgical

## 2020-07-27 ENCOUNTER — Encounter: Payer: Self-pay | Admitting: Medical-Surgical

## 2020-07-27 DIAGNOSIS — I10 Essential (primary) hypertension: Secondary | ICD-10-CM

## 2020-07-27 MED ORDER — LOSARTAN POTASSIUM 25 MG PO TABS: 25.0000 mg | ORAL_TABLET | Freq: Every day | ORAL | 1 refills | Status: DC

## 2020-07-27 NOTE — Progress Notes (Signed)
Subjective:    CC: HTN follow up  HPI: Pleasant 49 year old presenting today for follow up on HTN. Prescribed Losartan 25mg  daily but has only been Taking 12.5mg  daily, tolerating well without side effects. Checking BP intermittently at home but notes his BP cuff was not accurate at his last nurse visit when he brought it to check it against our machines. Readings in the mid to high 130s systolicly at home. Exercising regularly, likes mountain biking even in colder weather. Denies CP, SOB, palpitations, lower extremity edema, dizziness, headaches, or vision changes.  I reviewed the past medical history, family history, social history, surgical history, and allergies today and no changes were needed.  Please see the problem list section below in epic for further details.  Past Medical History: Past Medical History:  Diagnosis Date  . 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 Hypertension   . INTERMITTENT VERTIGO 11/15/2009   Qualifier: Diagnosis of  By: 11/17/2009 MD, Thurmond Butts    . 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 APPENDECTOMY     Social History: Social History   Socioeconomic History  . Marital status: Married    Spouse name: Not on file  . Number of children: Not on file  . Years of education: Not on file  . Highest education level: Not on file  Occupational History  . Not on file  Tobacco Use  . Smoking status: Former Smoker    Packs/day: 0.25    Years: 6.00    Pack years: 1.50    Quit date: 09/14/1997    Years since quitting: 22.8  . Smokeless tobacco: Never Used  Vaping Use  . Vaping Use: Never used  Substance and Sexual Activity  . Alcohol use: Yes    Alcohol/week: 12.0 standard drinks    Types: 12 Cans of beer per week  . Drug use: Never  . Sexual activity: Not  Currently  Other Topics Concern  . Not on file  Social History Narrative  . Not on file   Social Determinants of Health   Financial Resource Strain: Not on file  Food Insecurity: Not on file  Transportation Needs: Not on file  Physical Activity: Not on file  Stress: Not on file  Social Connections: Not on file   Family History: Family History  Problem Relation Age of Onset  . Hypertension Father   . Heart disease Father   . Stroke Father   . Diabetes Paternal Aunt   . Diabetes Paternal Grandfather    Allergies: No Known Allergies Medications: See med rec.  Review of Systems: See HPI for pertinent positives and negatives.   Objective:    General: Well Developed, well nourished, and in no acute distress.  Neuro: Alert and oriented x3.  HEENT: Normocephalic, atraumatic.  Skin: Warm and dry. Cardiac: Regular rate and rhythm, no murmurs rubs or gallops, no lower extremity edema.  Respiratory: Clear to auscultation bilaterally. Not using accessory muscles, speaking in full sentences.  Impression and Recommendations:    1. Hypertension goal BP (blood pressure) < 130/80 BP right at goal today. Okay to continue Losartan at 12.5mg  daily. Monitor BP at home, recommend getting a new machine for accuracy. If readings at home consistently higher than 130/80, take the full tablet of Losartan. Continue  regular exercise and limit dietary sodium.  - losartan (COZAAR) 25 MG tablet; Take 1 tablet (25 mg total) by mouth daily.  Dispense: 90 tablet; Refill: 1  Return in about 6 months (around 01/25/2021) for HTN/HLD follow up. ___________________________________________ Thayer Ohm, DNP, APRN, FNP-BC Primary Care and Sports Medicine [facility] [place]

## 2020-08-27 ENCOUNTER — Other Ambulatory Visit: Payer: Self-pay

## 2020-08-27 ENCOUNTER — Emergency Department (INDEPENDENT_AMBULATORY_CARE_PROVIDER_SITE_OTHER)
Admission: RE | Admit: 2020-08-27 | Discharge: 2020-08-27 | Disposition: A | Discharge status: Home / Self Care | Payer: BC Managed Care – PPO | Source: Ambulatory Visit

## 2020-08-27 VITALS — BP 149/77 | HR 109 | Temp 99.8°F

## 2020-08-27 DIAGNOSIS — E860 Dehydration: Secondary | ICD-10-CM

## 2020-08-27 DIAGNOSIS — B349 Viral infection, unspecified: Secondary | ICD-10-CM

## 2020-08-27 DIAGNOSIS — R5383 Other fatigue: Secondary | ICD-10-CM

## 2020-08-27 LAB — POCT URINALYSIS DIP (MANUAL ENTRY)
Blood, UA: NEGATIVE
Glucose, UA: NEGATIVE mg/dL
Ketones, POC UA: NEGATIVE mg/dL
Leukocytes, UA: NEGATIVE
Nitrite, UA: NEGATIVE
Protein Ur, POC: 30 mg/dL — AB
Spec Grav, UA: >=1.03 — AB (ref 1.010–1.025)
Urobilinogen, UA: 0.2 E.U./dL
pH, UA: 5.5 (ref 5.0–8.0)

## 2020-08-27 LAB — COMPLETE METABOLIC PANEL WITH GFR
AG Ratio: 1.6 (calc) (ref 1.0–2.5)
ALT: 51 U/L — ABNORMAL HIGH (ref 9–46)
AST: 33 U/L (ref 10–35)
Albumin: 4.5 g/dL (ref 3.6–5.1)
Alkaline phosphatase (APISO): 106 U/L (ref 35–144)
BUN: 17 mg/dL (ref 7–25)
CO2: 31 mmol/L (ref 20–32)
Calcium: 9.2 mg/dL (ref 8.6–10.3)
Chloride: 99 mmol/L (ref 98–110)
Creat: 1.25 mg/dL (ref 0.70–1.33)
GFR, Est African American: 77 mL/min/{1.73_m2} (ref >=60)
GFR, Est Non African American: 67 mL/min/{1.73_m2} (ref >=60)
Globulin: 2.8 g/dL (calc) (ref 1.9–3.7)
Glucose, Bld: 97 mg/dL (ref 65–99)
Potassium: 4.3 mmol/L (ref 3.5–5.3)
Sodium: 136 mmol/L (ref 135–146)
Total Bilirubin: 0.7 mg/dL (ref 0.2–1.2)
Total Protein: 7.3 g/dL (ref 6.1–8.1)

## 2020-08-27 LAB — POCT CBC W AUTO DIFF (K'VILLE URGENT CARE)

## 2020-08-27 NOTE — ED Triage Notes (Signed)
Appointment

## 2020-08-27 NOTE — ED Triage Notes (Signed)
Patient c/o no energy, fever on and off x 7 days, sharp stomach pains, no nausea, no vomiting, diarrhea.  Patient has not taken anything OTC.  Patient is not vaccinated.

## 2020-08-27 NOTE — ED Provider Notes (Addendum)
[facility]    CSN: [id number] Arrival date & time: 08/27/20  7564      History   Chief Complaint Chief Complaint  Patient presents with  . Fatigue    HPI Stephen Nelson is a 50 y.o. male.   HPI  Stephen Nelson is a 50 y.o. male presenting to UC with c/o fatigue and intermittent low-grade fever of 99*F for about 7 days. Intermittent mild sharp abdominal pain that he related to loss of appetite and not eating but denies n/v/d. Denies abdominal pain at this time.  No medications taken PTA.  Denies HA, sore throat, cough, congestion. No known sick contacts. He has not been vaccinated for COVID or flu.   Past Medical History:  Diagnosis Date  . 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 Hypertension   . INTERMITTENT VERTIGO 11/15/2009   Qualifier: Diagnosis of  By: Thurmond Butts MD, Dennard Nip    . 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 Mixed hyperlipidemia 03/31/2018  . Hypertension goal BP (blood pressure) < 130/80 12/30/2017  . BMI 28.0-28.9,adult 12/30/2017  . Hyperkalemia 09/14/2012  . Leukocytosis 09/14/2012    Past Surgical History:  Procedure Laterality Date  . APPENDECTOMY         Home Medications    Prior to Admission medications   Medication Sig Start Date End Date Taking? Authorizing Provider  atorvastatin (LIPITOR) 20 MG tablet Take 1 tablet (20 mg total) by mouth daily. 05/16/20  Yes Christen Butter, NP  losartan (COZAAR) 25 MG tablet Take 1 tablet (25 mg total) by mouth daily. 07/27/20  Yes Christen Butter, NP    Family History Family History  Problem Relation Age of Onset  . Hypertension Father   . Heart disease Father   . Stroke Father   . Diabetes Paternal Aunt   . Diabetes Paternal Grandfather     Social History Social History    Tobacco Use  . Smoking status: Former Smoker    Packs/day: 0.25    Years: 6.00    Pack years: 1.50    Quit date: 09/14/1997    Years since quitting: 22.9  . Smokeless tobacco: Never Used  Vaping Use  . Vaping Use: Never used  Substance Use Topics  . Alcohol use: Yes    Alcohol/week: 12.0 standard drinks    Types: 12 Cans of beer per week  . Drug use: Never     Allergies   Patient has no known allergies.   Review of Systems Review of Systems  Constitutional: Positive for fatigue and fever (low-grade). Negative for chills.  HENT: Negative for congestion, ear pain, sore throat, trouble swallowing and voice change.   Respiratory: Negative for cough, chest tightness, shortness of breath and wheezing.   Cardiovascular: Negative for chest pain, palpitations and leg swelling.  Gastrointestinal: Positive for abdominal pain (intermittent). Negative for diarrhea, nausea and vomiting.  Musculoskeletal: Negative for arthralgias, back pain and myalgias.  Skin: Negative for rash.  Neurological: Negative for dizziness, light-headedness and headaches.  All other systems reviewed and are negative.    Physical Exam Triage Vital Signs ED Triage Vitals [08/27/20 0848]  Enc Vitals Group     BP (!) 149/77     Pulse Rate Marland Kitchen)  120     Resp      Temp 99.8 F (37.7 C)     Temp Source Oral     SpO2 95 %     Weight      Height      Head Circumference      Peak Flow      Pain Score 0     Pain Loc      Pain Edu?      Excl. in GC?    No data found.  Updated Vital Signs BP (!) 149/77 (BP Location: Right Arm)   Pulse (!) 109   Temp 99.8 F (37.7 C) (Oral)   SpO2 95%   Visual Acuity Right Eye Distance:   Left Eye Distance:   Bilateral Distance:    Right Eye Near:   Left Eye Near:    Bilateral Near:     Physical Exam Vitals and nursing note reviewed.  Constitutional:      General: He is not in acute distress.    Appearance: Normal appearance. He is well-developed and  well-nourished. He is not ill-appearing, toxic-appearing or diaphoretic.     Comments: Pt sitting comfortably in exam chair, NAD. Alert and cooperative during exam.  HENT:     Head: Normocephalic and atraumatic.     Right Ear: Tympanic membrane and ear canal normal.     Left Ear: Tympanic membrane and ear canal normal.     Nose: Nose normal.     Right Sinus: No maxillary sinus tenderness or frontal sinus tenderness.     Left Sinus: No maxillary sinus tenderness or frontal sinus tenderness.     Mouth/Throat:     Lips: Pink.     Mouth: Mucous membranes are moist.     Pharynx: Oropharynx is clear. Uvula midline. No pharyngeal swelling, oropharyngeal exudate, posterior oropharyngeal erythema or uvula swelling.  Eyes:     Extraocular Movements: EOM normal.  Cardiovascular:     Rate and Rhythm: Regular rhythm. Tachycardia present.     Comments: Mild tachycardia on exam Pulmonary:     Effort: Pulmonary effort is normal. No respiratory distress.     Breath sounds: Normal breath sounds. No stridor. No wheezing, rhonchi or rales.  Abdominal:     General: There is no distension.     Palpations: Abdomen is soft.     Tenderness: There is no abdominal tenderness. There is no right CVA tenderness or left CVA tenderness.  Musculoskeletal:        General: Normal range of motion.     Cervical back: Normal range of motion.  Skin:    General: Skin is warm and dry.  Neurological:     Mental Status: He is alert and oriented to person, place, and time.  Psychiatric:        Mood and Affect: Mood and affect normal.        Behavior: Behavior normal.      UC Treatments / Results  Labs (all labs ordered are listed, but only abnormal results are displayed) Labs Reviewed  POCT URINALYSIS DIP (MANUAL ENTRY) - Abnormal; Notable for the following components:      Result Value   Bilirubin, UA small (*)    Spec Grav, UA >=1.030 (*)    Protein Ur, POC =30 (*)    All other components within normal limits   COVID-19, FLU A+B NAA  COMPLETE METABOLIC PANEL WITH GFR  POCT CBC W AUTO DIFF ([facility])    EKG  Radiology No results found.  Procedures Procedures (including critical care time)  Medications Ordered in UC Medications - No data to display  Initial Impression / Assessment and Plan / UC Course  I have reviewed the triage vital signs and the nursing notes.  Pertinent labs & imaging results that were available during my care of the patient were reviewed by me and considered in my medical decision making (see chart for details).    Tachycardic in triage, improved with recheck on exam Pt denies chest pain, palpitations or SOB O2 Sat 95% on RA, same reading in 10/2016 when seen at [facility] for neck pain PE or ACS unlikely at this time.   UA c/w mild dehydration HENT exam: WNL Lungs: CTAB Abdominal exam: benign   Suspect viral illness COVID/Flu test Pending Encouraged good hydration, discussed return precautions. Discussed symptoms that warrant emergent care in the ED. AVS given  Final Clinical Impressions(s) / UC Diagnoses   Final diagnoses:  Fatigue, unspecified type  Mild dehydration  Viral illness     Discharge Instructions      You may take 500mg  acetaminophen every 4-6 hours or in combination with ibuprofen 400-600mg  every 6-8 hours as needed for pain, inflammation, and fever.  Be sure to well hydrated with clear liquids and get at least 8 hours of sleep at night, preferably more while sick.   Please follow up with family medicine in 1 week if needed.   Call 911 or have someone drive you to the hospital if symptoms significantly worsening- including chest pain, trouble breathing, dizziness/passing out, fever, or other new concerning symptoms develop.      ED Prescriptions    None     PDMP not reviewed this encounter.   , PA-C 08/27/20 0957    10/25/20, PA-C 08/27/20 [phone]

## 2020-08-27 NOTE — Discharge Instructions (Signed)
°  You may take 500mg  acetaminophen every 4-6 hours or in combination with ibuprofen 400-600mg  every 6-8 hours as needed for pain, inflammation, and fever.  Be sure to well hydrated with clear liquids and get at least 8 hours of sleep at night, preferably more while sick.   Please follow up with family medicine in 1 week if needed.   Call 911 or have someone drive you to the hospital if symptoms significantly worsening- including chest pain, trouble breathing, dizziness/passing out, fever, or other new concerning symptoms develop.

## 2020-08-29 LAB — COVID-19, FLU A+B NAA
Influenza A, NAA: NOT DETECTED
Influenza B, NAA: NOT DETECTED
SARS-CoV-2, NAA: DETECTED — AB

## 2021-01-25 ENCOUNTER — Ambulatory Visit: Payer: BC Managed Care – PPO | Admitting: Medical-Surgical

## 2021-01-29 ENCOUNTER — Encounter: Payer: Self-pay | Admitting: Medical-Surgical

## 2021-01-29 ENCOUNTER — Other Ambulatory Visit: Payer: Self-pay

## 2021-01-29 ENCOUNTER — Ambulatory Visit: Payer: BC Managed Care – PPO | Admitting: Medical-Surgical

## 2021-01-29 VITALS — BP 134/83 | HR 68 | Temp 98.8°F | Ht 68.0 in | Wt 188.0 lb

## 2021-01-29 DIAGNOSIS — I10 Essential (primary) hypertension: Secondary | ICD-10-CM

## 2021-01-29 DIAGNOSIS — E782 Mixed hyperlipidemia: Secondary | ICD-10-CM

## 2021-01-29 LAB — COMPLETE METABOLIC PANEL WITH GFR
AG Ratio: 2 (calc) (ref 1.0–2.5)
ALT: 35 U/L (ref 9–46)
AST: 23 U/L (ref 10–35)
Albumin: 4.6 g/dL (ref 3.6–5.1)
Alkaline phosphatase (APISO): 69 U/L (ref 35–144)
BUN: 14 mg/dL (ref 7–25)
CO2: 29 mmol/L (ref 20–32)
Calcium: 9.8 mg/dL (ref 8.6–10.3)
Chloride: 106 mmol/L (ref 98–110)
Creat: 1.07 mg/dL (ref 0.70–1.33)
GFR, Est African American: 93 mL/min/{1.73_m2} (ref >=60)
GFR, Est Non African American: 80 mL/min/{1.73_m2} (ref >=60)
Globulin: 2.3 g/dL (calc) (ref 1.9–3.7)
Glucose, Bld: 96 mg/dL (ref 65–99)
Potassium: 4.1 mmol/L (ref 3.5–5.3)
Sodium: 142 mmol/L (ref 135–146)
Total Bilirubin: 0.5 mg/dL (ref 0.2–1.2)
Total Protein: 6.9 g/dL (ref 6.1–8.1)

## 2021-01-29 LAB — CBC WITH DIFFERENTIAL/PLATELET
Absolute Monocytes: 524 cells/uL (ref 200–950)
Basophils Absolute: 41 cells/uL (ref 0–200)
Basophils Relative: 0.6 %
Eosinophils Absolute: 218 cells/uL (ref 15–500)
Eosinophils Relative: 3.2 %
HCT: 45.6 % (ref 38.5–50.0)
Hemoglobin: 16 g/dL (ref 13.2–17.1)
Lymphs Abs: 2489 cells/uL (ref 850–3900)
MCH: 30.2 pg (ref 27.0–33.0)
MCHC: 35.1 g/dL (ref 32.0–36.0)
MCV: 86 fL (ref 80.0–100.0)
MPV: 11.2 fL (ref 7.5–12.5)
Monocytes Relative: 7.7 %
Neutro Abs: 3529 cells/uL (ref 1500–7800)
Neutrophils Relative %: 51.9 %
Platelets: 223 10*3/uL (ref 140–400)
RBC: 5.3 10*6/uL (ref 4.20–5.80)
RDW: 12.8 % (ref 11.0–15.0)
Total Lymphocyte: 36.6 %
WBC: 6.8 10*3/uL (ref 3.8–10.8)

## 2021-01-29 LAB — LIPID PANEL
Cholesterol: 161 mg/dL (ref <200)
HDL: 46 mg/dL (ref >=40)
LDL Cholesterol (Calc): 97 mg/dL (calc)
Non-HDL Cholesterol (Calc): 115 mg/dL (calc) (ref <130)
Total CHOL/HDL Ratio: 3.5 (calc) (ref <5.0)
Triglycerides: 90 mg/dL (ref <150)

## 2021-01-29 MED ORDER — LOSARTAN POTASSIUM 25 MG PO TABS
25.0000 mg | ORAL_TABLET | Freq: Every day | ORAL | 1 refills | Status: DC
Start: 2021-01-29 — End: 2021-11-05

## 2021-01-29 MED ORDER — ATORVASTATIN CALCIUM 20 MG PO TABS: 20.0000 mg | ORAL_TABLET | Freq: Every day | ORAL | 3 refills | Status: DC

## 2021-01-29 NOTE — Progress Notes (Signed)
Subjective:    CC: Hypertension/hyperlipidemia follow-up  HPI: Pleasant 50 year old male presenting today for follow-up on:  Hypertension-taking losartan 12.5 mg daily, tolerating well without side effects.  Has started exercising approximately once weekly.  Prefers to ride his bike approximately 10 miles.  Has intentions to increase his exercise frequency to at least twice weekly. Denies CP, SOB, palpitations, lower extremity edema, dizziness, headaches, or vision changes.  Hyperlipidemia-taking atorvastatin 20 mg daily, tolerating well without side effects.  I reviewed the past medical history, family history, social history, surgical history, and allergies today and no changes were needed.  Please see the problem list section below in epic for further details.  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   Qualifier: Diagnosis of  By: Cathren Harsh MD, Yehuda Savannah: Diagnosis of  By: Conrad [place]     Hypertension    INTERMITTENT VERTIGO 11/15/2009   Qualifier: Diagnosis of  By: Thurmond Butts MD, Sherlynn Carbon 04/02/2010   Qualifier: Diagnosis of  By: Orson Aloe MD, Tinnie Gens     Past Surgical History: Past Surgical History:  Procedure Laterality Date   APPENDECTOMY     Social History: Social History   Socioeconomic History   Marital status: Married    Spouse name: Not on file   Number of children: Not on file   Years of education: Not on file   Highest education level: Not on file  Occupational History   Not on file  Tobacco Use   Smoking status: Former    Packs/day: 0.25    Years: 6.00    Pack years: 1.50    Types: Cigarettes    Quit date: 09/14/1997    Years since quitting: 23.3   Smokeless tobacco: Never  Vaping Use   Vaping Use: Never used  Substance and Sexual Activity   Alcohol use: Yes    Alcohol/week: 12.0 standard drinks    Types: 12 Cans of beer per week   Drug use: Never   Sexual activity: Not Currently   Other Topics Concern   Not on file  Social History Narrative   Not on file   Social Determinants of Health   Financial Resource Strain: Not on file  Food Insecurity: Not on file  Transportation Needs: Not on file  Physical Activity: Not on file  Stress: Not on file  Social Connections: Not on file   Family History: Family History  Problem Relation Age of Onset   Hypertension Father    Heart disease Father    Stroke Father    Diabetes Paternal Aunt    Diabetes Paternal Grandfather    Allergies: No Known Allergies Medications: See med rec.  Review of Systems: See HPI for pertinent positives and negatives.   Objective:    General: Well Developed, well nourished, and in no acute distress.  Neuro: Alert and oriented x3.  HEENT: Normocephalic, atraumatic.  Skin: Warm and dry. Cardiac: Regular rate and rhythm, no murmurs rubs or gallops, no lower extremity edema.  Respiratory: Clear to auscultation bilaterally. Not using accessory muscles, speaking in full sentences.  Impression and Recommendations:    1. Hypertension goal BP (blood pressure) < 130/80 Checking CBC with differential, CMP, and lipid panel today.  Continue losartan 1/2 tablet daily as prescribed.  Monitor blood pressure at home with goal of 130/80 or less.  If consistently running higher, increase dose of losartan to 1 full tablet daily.  Continue regular exercise. - CBC  with Differential/Platelet - COMPLETE METABOLIC PANEL WITH GFR - Lipid panel - losartan (COZAAR) 25 MG tablet; Take 1 tablet (25 mg total) by mouth daily.  Dispense: 90 tablet; Refill: 1  2. Mixed hyperlipidemia Checking labs.  Continue atorvastatin 20 mg daily. - CBC with Differential/Platelet - COMPLETE METABOLIC PANEL WITH GFR - Lipid panel - atorvastatin (LIPITOR) 20 MG tablet; Take 1 tablet (20 mg total) by mouth daily.  Dispense: 90 tablet; Refill: 3  Return in about 6 months (around 07/31/2021) for HTN/HLD follow  up. ___________________________________________ Thayer Ohm, DNP, APRN, FNP-BC Primary Care and Sports Medicine [facility]

## 2021-07-31 ENCOUNTER — Ambulatory Visit: Payer: BC Managed Care – PPO | Admitting: Medical-Surgical

## 2021-08-30 ENCOUNTER — Encounter: Payer: Self-pay | Admitting: Medical-Surgical

## 2021-08-30 ENCOUNTER — Ambulatory Visit: Payer: BC Managed Care – PPO | Admitting: Medical-Surgical

## 2021-08-30 ENCOUNTER — Other Ambulatory Visit: Payer: Self-pay

## 2021-08-30 VITALS — BP 124/86 | HR 87 | Resp 20 | Ht 68.0 in | Wt 187.0 lb

## 2021-08-30 DIAGNOSIS — I10 Essential (primary) hypertension: Secondary | ICD-10-CM

## 2021-08-30 DIAGNOSIS — E782 Mixed hyperlipidemia: Secondary | ICD-10-CM

## 2021-08-30 LAB — CBC WITH DIFFERENTIAL/PLATELET
Absolute Monocytes: 422 cells/uL (ref 200–950)
Basophils Absolute: 27 cells/uL (ref 0–200)
Basophils Relative: 0.4 %
Eosinophils Absolute: 238 cells/uL (ref 15–500)
Eosinophils Relative: 3.5 %
HCT: 45.6 % (ref 38.5–50.0)
Hemoglobin: 15.9 g/dL (ref 13.2–17.1)
Lymphs Abs: 2441 cells/uL (ref 850–3900)
MCH: 30.2 pg (ref 27.0–33.0)
MCHC: 34.9 g/dL (ref 32.0–36.0)
MCV: 86.7 fL (ref 80.0–100.0)
MPV: 11.1 fL (ref 7.5–12.5)
Monocytes Relative: 6.2 %
Neutro Abs: 3672 cells/uL (ref 1500–7800)
Neutrophils Relative %: 54 %
Platelets: 245 10*3/uL (ref 140–400)
RBC: 5.26 10*6/uL (ref 4.20–5.80)
RDW: 12.1 % (ref 11.0–15.0)
Total Lymphocyte: 35.9 %
WBC: 6.8 10*3/uL (ref 3.8–10.8)

## 2021-08-30 LAB — COMPLETE METABOLIC PANEL WITH GFR
AG Ratio: 2 (calc) (ref 1.0–2.5)
ALT: 38 U/L (ref 9–46)
AST: 25 U/L (ref 10–35)
Albumin: 4.6 g/dL (ref 3.6–5.1)
Alkaline phosphatase (APISO): 81 U/L (ref 35–144)
BUN: 18 mg/dL (ref 7–25)
CO2: 33 mmol/L — ABNORMAL HIGH (ref 20–32)
Calcium: 9.9 mg/dL (ref 8.6–10.3)
Chloride: 104 mmol/L (ref 98–110)
Creat: 1.08 mg/dL (ref 0.70–1.30)
Globulin: 2.3 g/dL (calc) (ref 1.9–3.7)
Glucose, Bld: 81 mg/dL (ref 65–99)
Potassium: 5.2 mmol/L (ref 3.5–5.3)
Sodium: 141 mmol/L (ref 135–146)
Total Bilirubin: 0.7 mg/dL (ref 0.2–1.2)
Total Protein: 6.9 g/dL (ref 6.1–8.1)
eGFR: 83 mL/min/{1.73_m2} (ref >=60)

## 2021-08-30 LAB — LIPID PANEL
Cholesterol: 170 mg/dL (ref <200)
HDL: 45 mg/dL (ref >=40)
LDL Cholesterol (Calc): 105 mg/dL (calc) — ABNORMAL HIGH
Non-HDL Cholesterol (Calc): 125 mg/dL (calc) (ref <130)
Total CHOL/HDL Ratio: 3.8 (calc) (ref <5.0)
Triglycerides: 100 mg/dL (ref <150)

## 2021-08-30 NOTE — Progress Notes (Signed)
° °  Established patient office visit  HPI with pertinent ROS:   CC: Hypertension follow-up  HPI: Pleasant 51 year old male presenting today for hypertension follow-up.  He has been taking losartan 12.5 mg daily, tolerating well without side effects.  Notes he has missed maybe 4 doses in the last 6 months and this is usually due to traveling and the schedule confusion.  He does not check blood pressure at home although he was doing so regularly.  He does have a machine that measures on his arm.  Does not add salt to foods and usually follows a low-sodium diet although he does occasionally have fast food and other high sodium products.  Exercises regularly. Denies CP, SOB, palpitations, lower extremity edema, dizziness, headaches, or vision changes.  Hyperlipidemia-taking atorvastatin 20 mg daily, tolerating well without side effects.  Follows a low-fat diet.  No concerns with darkened urine, fatigue, or myalgias.  I reviewed the past medical history, family history, social history, surgical history, and allergies today and no changes were needed.  Please see the problem list section below in epic for further details.   Brief exam, Assessment, and Plan:   Today's Vitals: BP 124/86    Pulse 87    Resp 20    Ht 5\' 8"  (1.727 m)    Wt 187 lb (84.8 kg)    SpO2 97%    BMI 28.43 kg/m   1. Hypertension goal BP (blood pressure) < 130/80 On exam, patient is alert, oriented, interactive and very pleasant.  Heart rate regular with no extra heart sounds, murmurs, gallops, or rubs.  Respirations unlabored with clear lung sounds.  Blood pressure slightly elevated on arrival but good on recheck.  Continue losartan 12.5 mg daily as prescribed.  Monitor blood pressures at home with goal of 130/80 or less.  If consistently higher than that, recommend returning for further evaluation as we may need to increase his losartan to a full tablet once daily.  Continue low-sodium diet and regular exercise. - CBC with  Differential - COMPLETE METABOLIC PANEL WITH GFR - Lipid panel  2. Mixed hyperlipidemia Checking lipid panel today.  Continue atorvastatin 20 mg daily. - Lipid panel  Return in about 6 months (around 02/27/2022) for HTN/HLD follow up. ___________________________________________ 04/30/2022, DNP, APRN, FNP-BC Primary Care and Sports Medicine [facility] [place]

## 2021-11-04 ENCOUNTER — Other Ambulatory Visit: Payer: Self-pay | Admitting: Medical-Surgical

## 2021-11-04 DIAGNOSIS — I10 Essential (primary) hypertension: Secondary | ICD-10-CM

## 2022-02-14 ENCOUNTER — Other Ambulatory Visit: Payer: Self-pay | Admitting: Medical-Surgical

## 2022-02-14 DIAGNOSIS — E782 Mixed hyperlipidemia: Secondary | ICD-10-CM

## 2022-02-26 NOTE — Progress Notes (Signed)
Established Patient Office Visit  Subjective   Patient ID: Stephen Nelson, male   DOB: 05/04/1971 Age: 51 y.o. MRN: [id number]   Chief Complaint  Patient presents with   Follow-up    HPI Pleasant 51 year old male presenting today for follow-up on:  Hypertension: Medication: losartan 25mg  daily Compliant: no, takes 1/2 tablet Side effects: none Checking BP at home: no Low sodium diet: no, still "terrible" Exercise: biking, running regularly Concerning symptoms:   Hyperlipidemia: Medication: atorvastatin 20mg  daily Side effects: none Low fat diet: room for improvement   Objective:    Vitals:   02/27/22 0813 02/27/22 0833  BP: (!) 159/113 (!) 136/92  Pulse: 72   Height: 5\' 8"  (1.727 m)   Weight: 184 lb (83.5 kg)   SpO2: 99%   BMI (Calculated): 27.98    Physical Exam Vitals reviewed.  Constitutional:      General: He is not in acute distress.    Appearance: Normal appearance. He is not ill-appearing.  HENT:     Head: Normocephalic.  Cardiovascular:     Rate and Rhythm: Normal rate.     Pulses: Normal pulses.     Heart sounds: Normal heart sounds. No murmur heard.    No friction rub. No gallop.  Pulmonary:     Effort: Pulmonary effort is normal. No respiratory distress.     Breath sounds: Normal breath sounds.  Skin:    General: Skin is warm and dry.  Neurological:     Mental Status: He is alert and oriented to person, place, and time.  Psychiatric:        Mood and Affect: Mood normal.        Behavior: Behavior normal.        Thought Content: Thought content normal.        Judgment: Judgment normal.     No results found for this or any previous visit (from the past 24 hour(s)).     The 10-year ASCVD risk score (Arnett DK, et al., 2019) is: 4.9%   Values used to calculate the score:     Age: 15 years     Sex: Male     Is Non-Hispanic African American: No     Diabetic: No     Tobacco smoker: No     Systolic Blood Pressure: 136 mmHg     Is BP treated:  Yes     HDL Cholesterol: 45 mg/dL     Total Cholesterol: 170 mg/dL   Assessment & Plan:   1. Hypertension goal BP (blood pressure) < 130/80 Checking labs as below. Increase Losartan to 1 full tablet to total 25mg  daily. Work on low-sodium diet and monitor blood pressure at home if possible.  Goal for blood pressure should be 130/80 or less.  Requested patient to return in 2 weeks for nurse visit for blood pressure check to evaluate tolerance and response to the 25 mg dose of losartan.  If continues to remain high, we may need to increase to 50 mg daily. - CBC with Differential/Platelet - COMPLETE METABOLIC PANEL WITH GFR - Lipid panel  2. Mixed hyperlipidemia Checking CMP and lipid panel today.  Continue atorvastatin as prescribed. - COMPLETE METABOLIC PANEL WITH GFR - Lipid panel  3. Need for shingles vaccine/tetanus booster Patient aware of being overdue for shingles and tetanus vaccines.  Declined both of these today.  4. HIV screening declined 5. Screening for hepatitis C declined Discussed screening recommendations.  Patient has no concerns for either of these  infections or exposures and has declined screening for both.  Return in about 2 weeks (around 03/13/2022) for nurse visit for BP check.  ___________________________________________ Thayer Ohm, DNP, APRN, FNP-BC Primary Care and Sports Medicine [facility] [place]

## 2022-02-27 ENCOUNTER — Ambulatory Visit: Payer: BC Managed Care – PPO | Admitting: Medical-Surgical

## 2022-02-27 ENCOUNTER — Encounter: Payer: Self-pay | Admitting: Medical-Surgical

## 2022-02-27 VITALS — BP 136/92 | HR 72 | Ht 68.0 in | Wt 184.0 lb

## 2022-02-27 DIAGNOSIS — I10 Essential (primary) hypertension: Secondary | ICD-10-CM

## 2022-02-27 DIAGNOSIS — E782 Mixed hyperlipidemia: Secondary | ICD-10-CM

## 2022-02-27 DIAGNOSIS — Z23 Encounter for immunization: Secondary | ICD-10-CM

## 2022-02-27 DIAGNOSIS — Z2821 Immunization not carried out because of patient refusal: Secondary | ICD-10-CM

## 2022-02-27 DIAGNOSIS — Z5320 Procedure and treatment not carried out because of patient's decision for unspecified reasons: Secondary | ICD-10-CM | POA: Diagnosis not present

## 2022-02-27 LAB — CBC WITH DIFFERENTIAL/PLATELET
Basophils Absolute: 39 cells/uL (ref 0–200)
Basophils Relative: 0.6 %
HCT: 48.8 % (ref 38.5–50.0)
Hemoglobin: 16.3 g/dL (ref 13.2–17.1)
Platelets: 228 10*3/uL (ref 140–400)
Total Lymphocyte: 36.6 %
WBC: 6.5 10*3/uL (ref 3.8–10.8)

## 2022-02-28 LAB — COMPLETE METABOLIC PANEL WITH GFR
AG Ratio: 2.1 (calc) (ref 1.0–2.5)
ALT: 35 U/L (ref 9–46)
AST: 25 U/L (ref 10–35)
Albumin: 4.8 g/dL (ref 3.6–5.1)
Alkaline phosphatase (APISO): 84 U/L (ref 35–144)
BUN: 17 mg/dL (ref 7–25)
CO2: 29 mmol/L (ref 20–32)
Calcium: 10 mg/dL (ref 8.6–10.3)
Chloride: 103 mmol/L (ref 98–110)
Creat: 1.09 mg/dL (ref 0.70–1.30)
Globulin: 2.3 g/dL (calc) (ref 1.9–3.7)
Glucose, Bld: 79 mg/dL (ref 65–99)
Potassium: 4.5 mmol/L (ref 3.5–5.3)
Sodium: 142 mmol/L (ref 135–146)
Total Bilirubin: 0.9 mg/dL (ref 0.2–1.2)
Total Protein: 7.1 g/dL (ref 6.1–8.1)
eGFR: 82 mL/min/{1.73_m2} (ref >=60)

## 2022-02-28 LAB — CBC WITH DIFFERENTIAL/PLATELET
Absolute Monocytes: 442 cells/uL (ref 200–950)
Eosinophils Absolute: 202 cells/uL (ref 15–500)
Eosinophils Relative: 3.1 %
Lymphs Abs: 2379 cells/uL (ref 850–3900)
MCH: 29.3 pg (ref 27.0–33.0)
MCHC: 33.4 g/dL (ref 32.0–36.0)
MCV: 87.8 fL (ref 80.0–100.0)
MPV: 10.9 fL (ref 7.5–12.5)
Monocytes Relative: 6.8 %
Neutro Abs: 3439 cells/uL (ref 1500–7800)
Neutrophils Relative %: 52.9 %
RBC: 5.56 10*6/uL (ref 4.20–5.80)
RDW: 12.5 % (ref 11.0–15.0)

## 2022-02-28 LAB — LIPID PANEL
Cholesterol: 199 mg/dL (ref <200)
HDL: 42 mg/dL (ref >=40)
LDL Cholesterol (Calc): 132 mg/dL (calc) — ABNORMAL HIGH
Non-HDL Cholesterol (Calc): 157 mg/dL (calc) — ABNORMAL HIGH (ref <130)
Total CHOL/HDL Ratio: 4.7 (calc) (ref <5.0)
Triglycerides: 139 mg/dL (ref <150)

## 2022-03-13 ENCOUNTER — Ambulatory Visit (INDEPENDENT_AMBULATORY_CARE_PROVIDER_SITE_OTHER): Payer: BC Managed Care – PPO | Admitting: Medical-Surgical

## 2022-03-13 VITALS — BP 133/83 | HR 77 | Temp 98.7°F | Ht 68.0 in | Wt 185.0 lb

## 2022-03-13 DIAGNOSIS — I10 Essential (primary) hypertension: Secondary | ICD-10-CM

## 2022-03-13 NOTE — Progress Notes (Signed)
Agree with documentation as below.  Continue losartan 25 mg daily.  Follow-up in 3 months for hypertension. ___________________________________________ Thayer Ohm, DNP, APRN, FNP-BC Primary Care and Sports Medicine [facility] [place]

## 2022-03-13 NOTE — Progress Notes (Signed)
Patient is here for blood pressure check. Denies trouble sleeping, palpitations, dizziness, lightheadedness, blurry vision, chest pain, shortness of breath, headaches and/or medication problems.   Patient's blood pressure was within goal range. Patient advise to continue monitoring and record bp readings from home. Patient informed to schedule next appointment as advised by provider.

## 2022-05-10 ENCOUNTER — Other Ambulatory Visit: Payer: Self-pay | Admitting: Medical-Surgical

## 2022-05-10 DIAGNOSIS — I10 Essential (primary) hypertension: Secondary | ICD-10-CM

## 2022-05-19 ENCOUNTER — Other Ambulatory Visit: Payer: Self-pay | Admitting: Medical-Surgical

## 2022-05-19 DIAGNOSIS — E782 Mixed hyperlipidemia: Secondary | ICD-10-CM

## 2022-08-16 ENCOUNTER — Other Ambulatory Visit: Payer: Self-pay | Admitting: Medical-Surgical

## 2022-08-16 DIAGNOSIS — E782 Mixed hyperlipidemia: Secondary | ICD-10-CM

## 2022-11-12 ENCOUNTER — Other Ambulatory Visit: Payer: Self-pay | Admitting: Medical-Surgical

## 2022-11-12 DIAGNOSIS — I10 Essential (primary) hypertension: Secondary | ICD-10-CM

## 2022-11-13 ENCOUNTER — Other Ambulatory Visit: Payer: Self-pay | Admitting: Medical-Surgical

## 2022-11-13 DIAGNOSIS — E782 Mixed hyperlipidemia: Secondary | ICD-10-CM

## 2022-12-02 ENCOUNTER — Other Ambulatory Visit: Payer: Self-pay | Admitting: Medical-Surgical

## 2022-12-02 DIAGNOSIS — E782 Mixed hyperlipidemia: Secondary | ICD-10-CM

## 2023-02-07 ENCOUNTER — Other Ambulatory Visit: Payer: Self-pay | Admitting: Medical-Surgical

## 2023-02-07 DIAGNOSIS — I10 Essential (primary) hypertension: Secondary | ICD-10-CM

## 2023-03-09 ENCOUNTER — Other Ambulatory Visit: Payer: Self-pay | Admitting: Medical-Surgical

## 2023-03-09 DIAGNOSIS — I10 Essential (primary) hypertension: Secondary | ICD-10-CM

## 2023-03-09 DIAGNOSIS — E782 Mixed hyperlipidemia: Secondary | ICD-10-CM

## 2023-03-13 ENCOUNTER — Other Ambulatory Visit: Payer: Self-pay | Admitting: Medical-Surgical

## 2023-03-13 DIAGNOSIS — I10 Essential (primary) hypertension: Secondary | ICD-10-CM

## 2023-03-29 ENCOUNTER — Other Ambulatory Visit: Payer: Self-pay | Admitting: Medical-Surgical

## 2023-03-29 DIAGNOSIS — I10 Essential (primary) hypertension: Secondary | ICD-10-CM

## 2023-05-26 ENCOUNTER — Ambulatory Visit: Payer: BC Managed Care – PPO | Admitting: Medical-Surgical

## 2023-05-26 ENCOUNTER — Encounter: Payer: Self-pay | Admitting: Medical-Surgical

## 2023-05-26 VITALS — BP 129/70 | HR 64 | Resp 20 | Ht 68.0 in | Wt 190.2 lb

## 2023-05-26 DIAGNOSIS — E782 Mixed hyperlipidemia: Secondary | ICD-10-CM | POA: Diagnosis not present

## 2023-05-26 DIAGNOSIS — Z1211 Encounter for screening for malignant neoplasm of colon: Secondary | ICD-10-CM

## 2023-05-26 DIAGNOSIS — I10 Essential (primary) hypertension: Secondary | ICD-10-CM

## 2023-05-26 MED ORDER — ATORVASTATIN CALCIUM 20 MG PO TABS
20.0000 mg | ORAL_TABLET | Freq: Every day | ORAL | 3 refills | Status: DC
Start: 2023-05-26 — End: 2024-05-17

## 2023-05-26 MED ORDER — LOSARTAN POTASSIUM 25 MG PO TABS: 25.0000 mg | ORAL_TABLET | Freq: Every day | ORAL | 3 refills | Status: DC

## 2023-05-26 NOTE — Progress Notes (Signed)
        Established patient visit  History, exam, impression, and plan:  1. Hypertension goal BP (blood pressure) < 130/80 Pleasant 52 year old male presenting today for follow-up on hypertension.  He is currently taking losartan 25 mg daily, tolerating well without side effects.  Not regularly checking blood pressures at home.  Working to follow a low-sodium diet.  Working on regular intentional exercise and does biking as well as jogging.  Denies concerning symptoms.  Cardiopulmonary exam normal.  Checking labs as below.  Continue losartan 25 mg daily as prescribed. - CBC with Differential/Platelet - CMP14+EGFR - Lipid panel - losartan (COZAAR) 25 MG tablet; Take 1 tablet (25 mg total) by mouth daily.  Dispense: 90 tablet; Refill: 3  2. Mixed hyperlipidemia Previously taking atorvastatin 20 mg daily but has been out for the last 2-3 weeks due to needing a follow-up appointment.  Tolerated the medication well without side effects.  Activity and diet as noted above.  Checking lipids today.  Resume atorvastatin 20 mg daily. - CMP14+EGFR - Lipid panel - atorvastatin (LIPITOR) 20 MG tablet; Take 1 tablet (20 mg total) by mouth daily.  Dispense: 90 tablet; Refill: 3  3. Colon cancer screening Due for colon cancer screening.  Previously did Cologuard and is open to repeating this.  Cologuard ordered today. - Cologuard   Procedures performed this visit: None.  Return in about 6 months (around 11/24/2023) for HLD/HTN follow up.  __________________________________ Thayer Ohm, DNP, APRN, FNP-BC Primary Care and Sports Medicine [facility] [place]

## 2023-05-27 LAB — CMP14+EGFR
ALT: 29 [IU]/L (ref 0–44)
AST: 23 [IU]/L (ref 0–40)
Albumin: 4.6 g/dL (ref 3.8–4.9)
Alkaline Phosphatase: 94 [IU]/L (ref 44–121)
BUN/Creatinine Ratio: 16 (ref 9–20)
BUN: 15 mg/dL (ref 6–24)
Bilirubin Total: 0.4 mg/dL (ref 0.0–1.2)
CO2: 24 mmol/L (ref 20–29)
Calcium: 9.4 mg/dL (ref 8.7–10.2)
Chloride: 101 mmol/L (ref 96–106)
Creatinine, Ser: 0.96 mg/dL (ref 0.76–1.27)
Globulin, Total: 2 g/dL (ref 1.5–4.5)
Glucose: 84 mg/dL (ref 70–99)
Potassium: 4.4 mmol/L (ref 3.5–5.2)
Sodium: 141 mmol/L (ref 134–144)
Total Protein: 6.6 g/dL (ref 6.0–8.5)
eGFR: 95 mL/min/{1.73_m2} (ref >59)

## 2023-05-27 LAB — LIPID PANEL
Chol/HDL Ratio: 6.2 {ratio} — ABNORMAL HIGH (ref 0.0–5.0)
Cholesterol, Total: 242 mg/dL — ABNORMAL HIGH (ref 100–199)
HDL: 39 mg/dL — ABNORMAL LOW (ref >39)
LDL Chol Calc (NIH): 145 mg/dL — ABNORMAL HIGH (ref 0–99)
Triglycerides: 315 mg/dL — ABNORMAL HIGH (ref 0–149)
VLDL Cholesterol Cal: 58 mg/dL — ABNORMAL HIGH (ref 5–40)

## 2023-05-27 LAB — CBC WITH DIFFERENTIAL/PLATELET
Basophils Absolute: 0 10*3/uL (ref 0.0–0.2)
Basos: 1 %
EOS (ABSOLUTE): 0.2 10*3/uL (ref 0.0–0.4)
Eos: 3 %
Hematocrit: 45.4 % (ref 37.5–51.0)
Hemoglobin: 15.5 g/dL (ref 13.0–17.7)
Immature Grans (Abs): 0 10*3/uL (ref 0.0–0.1)
Immature Granulocytes: 0 %
Lymphocytes Absolute: 2.9 10*3/uL (ref 0.7–3.1)
Lymphs: 38 %
MCH: 30.8 pg (ref 26.6–33.0)
MCHC: 34.1 g/dL (ref 31.5–35.7)
MCV: 90 fL (ref 79–97)
Monocytes Absolute: 0.7 10*3/uL (ref 0.1–0.9)
Monocytes: 9 %
Neutrophils Absolute: 3.8 10*3/uL (ref 1.4–7.0)
Neutrophils: 49 %
Platelets: 226 10*3/uL (ref 150–450)
RBC: 5.04 x10E6/uL (ref 4.14–5.80)
RDW: 12.3 % (ref 11.6–15.4)
WBC: 7.5 10*3/uL (ref 3.4–10.8)

## 2023-06-03 ENCOUNTER — Telehealth: Payer: Self-pay | Admitting: Medical-Surgical

## 2023-06-03 NOTE — Telephone Encounter (Signed)
Patient dropped off a form to be completed. Paperwork is in [employer].

## 2023-06-10 NOTE — Telephone Encounter (Signed)
Faxed today made copy for scan

## 2023-06-15 DIAGNOSIS — Z1211 Encounter for screening for malignant neoplasm of colon: Secondary | ICD-10-CM | POA: Diagnosis not present

## 2023-06-24 LAB — COLOGUARD: COLOGUARD: NEGATIVE

## 2024-05-15 ENCOUNTER — Other Ambulatory Visit: Payer: Self-pay | Admitting: Medical-Surgical

## 2024-05-15 DIAGNOSIS — E782 Mixed hyperlipidemia: Secondary | ICD-10-CM

## 2024-05-15 DIAGNOSIS — I10 Essential (primary) hypertension: Secondary | ICD-10-CM

## 2024-05-20 ENCOUNTER — Encounter: Payer: Self-pay | Admitting: Medical-Surgical

## 2024-05-20 ENCOUNTER — Ambulatory Visit (INDEPENDENT_AMBULATORY_CARE_PROVIDER_SITE_OTHER): Admitting: Medical-Surgical

## 2024-05-20 VITALS — BP 145/73 | HR 66 | Resp 20 | Ht 68.0 in | Wt 191.0 lb

## 2024-05-20 DIAGNOSIS — M25562 Pain in left knee: Secondary | ICD-10-CM | POA: Diagnosis not present

## 2024-05-20 DIAGNOSIS — I10 Essential (primary) hypertension: Secondary | ICD-10-CM

## 2024-05-20 DIAGNOSIS — M25561 Pain in right knee: Secondary | ICD-10-CM | POA: Insufficient documentation

## 2024-05-20 DIAGNOSIS — E782 Mixed hyperlipidemia: Secondary | ICD-10-CM

## 2024-05-20 MED ORDER — LOSARTAN POTASSIUM 25 MG PO TABS: 25.0000 mg | ORAL_TABLET | Freq: Every day | ORAL | 3 refills | Status: AC

## 2024-05-20 MED ORDER — ATORVASTATIN CALCIUM 20 MG PO TABS: 20.0000 mg | ORAL_TABLET | Freq: Every day | ORAL | 3 refills | Status: AC

## 2024-05-20 NOTE — Assessment & Plan Note (Signed)
 Managed with Lipitor without adherence issues or side effects. - Continue Lipitor 20 mg as prescribed. - Checking labs today.

## 2024-05-20 NOTE — Assessment & Plan Note (Addendum)
 Blood pressure slightly elevated due to stress and activity. Managed with losartan  and regular physical activity. - Blood pressure remains elevated on recheck - Continue losartan  25 mg daily as prescribed. - Work on reducing dietary sodium intake. - Continue regular intentional exercise. - Monitor blood pressure at home with a goal of 130/80 or less; if consistently elevated, we will need to make changes to his losartan  dose. - Checking labs today.

## 2024-05-20 NOTE — Assessment & Plan Note (Signed)
 Pain likely due to patellofemoral syndrome or patellar tendinitis from high-impact activity. - Recommend quality running shoes with support. - Consider knee support straps if pain persists. - Adjust activity as needed.

## 2024-05-20 NOTE — Progress Notes (Signed)
 Established patient visit   History of Present Illness   Discussed the use of AI scribe software for clinical note transcription with the patient, who gave verbal consent to proceed.  History of Present Illness   Stephen Nelson is a 53 year old male with hypertension and hyperlipidemia who presents for follow-up on blood pressure and cholesterol management.  Hypertension and hyperlipidemia management - Takes losartan  25 mg and atorvastatin  (Lipitor) 20 mg daily and feels stable on these medications - Does not regularly monitor blood pressure at home, though owns a blood pressure cuff - Believes elevated blood pressure is hereditary - Blood pressure may be higher during clinic visits  Knee pain with activity - Experiences knee pain associated with increased running activity while training for a 5K - Pain occurs during running or descending stairs - No pain during walking or at rest - Not taking medication for knee pain - Notes that he does not have supportive shoes for running yet  Substance use and environmental exposure - Consumes alcohol occasionally, about two to three beers every other week - Does not smoke or vape - Exposed to secondhand smoke, particularly from marijuana     Physical Exam   Physical Exam Vitals reviewed.  Constitutional:      General: He is not in acute distress.    Appearance: Normal appearance. He is not ill-appearing.  HENT:     Head: Normocephalic and atraumatic.  Cardiovascular:     Rate and Rhythm: Normal rate and regular rhythm.     Pulses: Normal pulses.     Heart sounds: Normal heart sounds. No murmur heard.    No friction rub. No gallop.  Pulmonary:     Effort: Pulmonary effort is normal. No respiratory distress.     Breath sounds: Normal breath sounds.  Skin:    General: Skin is warm and dry.  Neurological:     Mental Status: He is alert and oriented to person, place, and time.  Psychiatric:        Mood and Affect: Mood normal.         Behavior: Behavior normal.        Thought Content: Thought content normal.        Judgment: Judgment normal.    Assessment & Plan   Problem List Items Addressed This Visit       Cardiovascular and Mediastinum   Hypertension goal BP (blood pressure) < 130/80 - Primary   Blood pressure slightly elevated due to stress and activity. Managed with losartan  and regular physical activity. - Blood pressure remains elevated on recheck - Continue losartan  25 mg daily as prescribed. - Work on reducing dietary sodium intake. - Continue regular intentional exercise. - Monitor blood pressure at home with a goal of 130/80 or less; if consistently elevated, we will need to make changes to his losartan  dose. - Checking labs today.      Relevant Medications   atorvastatin  (LIPITOR) 20 MG tablet   losartan  (COZAAR ) 25 MG tablet   Other Relevant Orders   CBC   CMP14+EGFR   Lipid panel     Other   Acute pain of both knees   Pain likely due to patellofemoral syndrome or patellar tendinitis from high-impact activity. - Recommend quality running shoes with support. - Consider knee support straps if pain persists. - Adjust activity as needed.      Mixed hyperlipidemia   Managed with Lipitor without adherence issues or side effects. -  Continue Lipitor 20 mg as prescribed. - Checking labs today.      Relevant Medications   atorvastatin  (LIPITOR) 20 MG tablet   losartan  (COZAAR ) 25 MG tablet   Other Relevant Orders   CMP14+EGFR   Lipid panel   Follow up   Return in about 6 months (around 11/18/2024) for HTN follow up. __________________________________ Zada FREDRIK Palin, DNP, APRN, FNP-BC Primary Care and Sports Medicine [facility] [place]

## 2024-05-21 ENCOUNTER — Ambulatory Visit: Payer: Self-pay | Admitting: Medical-Surgical

## 2024-05-21 LAB — LIPID PANEL
Chol/HDL Ratio: 4.4 ratio (ref 0.0–5.0)
Cholesterol, Total: 180 mg/dL (ref 100–199)
HDL: 41 mg/dL (ref >39)
LDL Chol Calc (NIH): 113 mg/dL — ABNORMAL HIGH (ref 0–99)
Triglycerides: 146 mg/dL (ref 0–149)
VLDL Cholesterol Cal: 26 mg/dL (ref 5–40)

## 2024-05-21 LAB — CMP14+EGFR
ALT: 36 IU/L (ref 0–44)
AST: 26 IU/L (ref 0–40)
Albumin: 4.7 g/dL (ref 3.8–4.9)
Alkaline Phosphatase: 94 IU/L (ref 47–123)
BUN/Creatinine Ratio: 18 (ref 9–20)
BUN: 17 mg/dL (ref 6–24)
Bilirubin Total: 0.5 mg/dL (ref 0.0–1.2)
CO2: 21 mmol/L (ref 20–29)
Calcium: 9.7 mg/dL (ref 8.7–10.2)
Chloride: 104 mmol/L (ref 96–106)
Creatinine, Ser: 0.95 mg/dL (ref 0.76–1.27)
Globulin, Total: 2.2 g/dL (ref 1.5–4.5)
Glucose: 79 mg/dL (ref 70–99)
Potassium: 4.3 mmol/L (ref 3.5–5.2)
Sodium: 143 mmol/L (ref 134–144)
Total Protein: 6.9 g/dL (ref 6.0–8.5)
eGFR: 95 mL/min/1.73 (ref >59)

## 2024-05-21 LAB — CBC
Hematocrit: 48 % (ref 37.5–51.0)
Hemoglobin: 15.8 g/dL (ref 13.0–17.7)
MCH: 30 pg (ref 26.6–33.0)
MCHC: 32.9 g/dL (ref 31.5–35.7)
MCV: 91 fL (ref 79–97)
Platelets: 233 x10E3/uL (ref 150–450)
RBC: 5.27 x10E6/uL (ref 4.14–5.80)
RDW: 12.8 % (ref 11.6–15.4)
WBC: 8.5 x10E3/uL (ref 3.4–10.8)

## 2024-11-18 ENCOUNTER — Ambulatory Visit: Admitting: Medical-Surgical
